# Patient Record
Sex: Female | Born: 1970 | Race: White | Hispanic: No | Marital: Married | Smoking: Former smoker
Health system: Southern US, Community
[De-identification: ages and names within clinical notes are randomized; demographics above are authoritative.]

## PROBLEM LIST (undated history)

## (undated) DIAGNOSIS — M797 Fibromyalgia: Secondary | ICD-10-CM

## (undated) DIAGNOSIS — G4700 Insomnia, unspecified: Secondary | ICD-10-CM

## (undated) DIAGNOSIS — I729 Aneurysm of unspecified site: Secondary | ICD-10-CM

## (undated) DIAGNOSIS — E119 Type 2 diabetes mellitus without complications: Secondary | ICD-10-CM

## (undated) DIAGNOSIS — E785 Hyperlipidemia, unspecified: Secondary | ICD-10-CM

## (undated) DIAGNOSIS — F419 Anxiety disorder, unspecified: Secondary | ICD-10-CM

## (undated) DIAGNOSIS — E079 Disorder of thyroid, unspecified: Secondary | ICD-10-CM

## (undated) DIAGNOSIS — K219 Gastro-esophageal reflux disease without esophagitis: Secondary | ICD-10-CM

## (undated) DIAGNOSIS — R51 Headache: Secondary | ICD-10-CM

## (undated) DIAGNOSIS — F32A Depression, unspecified: Secondary | ICD-10-CM

## (undated) DIAGNOSIS — M25511 Pain in right shoulder: Secondary | ICD-10-CM

## (undated) DIAGNOSIS — E039 Hypothyroidism, unspecified: Secondary | ICD-10-CM

## (undated) DIAGNOSIS — M1990 Unspecified osteoarthritis, unspecified site: Secondary | ICD-10-CM

## (undated) DIAGNOSIS — F329 Major depressive disorder, single episode, unspecified: Secondary | ICD-10-CM

## (undated) DIAGNOSIS — R519 Headache, unspecified: Secondary | ICD-10-CM

## (undated) HISTORY — PX: EXCISION/RELEASE BURSA HIP: SHX5014

## (undated) HISTORY — PX: OTHER SURGICAL HISTORY: SHX169

## (undated) HISTORY — PX: PLANTAR FASCIECTOMY: SUR600

## (undated) HISTORY — PX: SHOULDER ARTHROSCOPY: SHX128

## (undated) HISTORY — PX: KNEE ARTHROSCOPY: SUR90

## (undated) HISTORY — PX: ABDOMINAL HYSTERECTOMY: SHX81

## (undated) HISTORY — PX: BRAIN SURGERY: SHX531

---
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
## 2018-07-24 DIAGNOSIS — M25521 Pain in right elbow: Secondary | ICD-10-CM | POA: Diagnosis not present

## 2018-07-24 DIAGNOSIS — S59901A Unspecified injury of right elbow, initial encounter: Secondary | ICD-10-CM | POA: Diagnosis not present

## 2018-08-13 DIAGNOSIS — M25521 Pain in right elbow: Secondary | ICD-10-CM | POA: Diagnosis not present

## 2018-08-13 DIAGNOSIS — M7701 Medial epicondylitis, right elbow: Secondary | ICD-10-CM | POA: Diagnosis not present

## 2018-08-27 DIAGNOSIS — M25521 Pain in right elbow: Secondary | ICD-10-CM | POA: Diagnosis not present

## 2018-08-27 DIAGNOSIS — G8929 Other chronic pain: Secondary | ICD-10-CM | POA: Diagnosis not present

## 2018-09-06 DIAGNOSIS — M25521 Pain in right elbow: Secondary | ICD-10-CM | POA: Diagnosis not present

## 2018-09-06 DIAGNOSIS — G8929 Other chronic pain: Secondary | ICD-10-CM | POA: Diagnosis not present

## 2018-09-09 DIAGNOSIS — M25521 Pain in right elbow: Secondary | ICD-10-CM | POA: Diagnosis not present

## 2018-09-09 DIAGNOSIS — G8929 Other chronic pain: Secondary | ICD-10-CM | POA: Diagnosis not present

## 2018-09-12 DIAGNOSIS — G8929 Other chronic pain: Secondary | ICD-10-CM | POA: Diagnosis not present

## 2018-09-12 DIAGNOSIS — M25521 Pain in right elbow: Secondary | ICD-10-CM | POA: Diagnosis not present

## 2018-09-17 DIAGNOSIS — M25511 Pain in right shoulder: Secondary | ICD-10-CM | POA: Diagnosis not present

## 2018-09-17 DIAGNOSIS — R21 Rash and other nonspecific skin eruption: Secondary | ICD-10-CM | POA: Diagnosis not present

## 2018-09-17 DIAGNOSIS — M542 Cervicalgia: Secondary | ICD-10-CM | POA: Diagnosis not present

## 2018-09-17 DIAGNOSIS — R399 Unspecified symptoms and signs involving the genitourinary system: Secondary | ICD-10-CM | POA: Diagnosis not present

## 2018-09-17 DIAGNOSIS — R300 Dysuria: Secondary | ICD-10-CM | POA: Diagnosis not present

## 2018-09-20 DIAGNOSIS — Z23 Encounter for immunization: Secondary | ICD-10-CM | POA: Diagnosis not present

## 2018-09-20 DIAGNOSIS — E034 Atrophy of thyroid (acquired): Secondary | ICD-10-CM | POA: Diagnosis not present

## 2018-09-20 DIAGNOSIS — E7800 Pure hypercholesterolemia, unspecified: Secondary | ICD-10-CM | POA: Diagnosis not present

## 2018-09-20 DIAGNOSIS — Z0000 Encounter for general adult medical examination without abnormal findings: Secondary | ICD-10-CM | POA: Diagnosis not present

## 2018-09-20 DIAGNOSIS — R102 Pelvic and perineal pain: Secondary | ICD-10-CM | POA: Diagnosis not present

## 2018-09-23 ENCOUNTER — Other Ambulatory Visit: Payer: Self-pay | Admitting: Internal Medicine

## 2018-09-23 DIAGNOSIS — Z1231 Encounter for screening mammogram for malignant neoplasm of breast: Secondary | ICD-10-CM

## 2018-09-23 DIAGNOSIS — I729 Aneurysm of unspecified site: Secondary | ICD-10-CM | POA: Diagnosis not present

## 2018-09-23 DIAGNOSIS — R251 Tremor, unspecified: Secondary | ICD-10-CM | POA: Diagnosis not present

## 2018-09-24 DIAGNOSIS — M7711 Lateral epicondylitis, right elbow: Secondary | ICD-10-CM | POA: Diagnosis not present

## 2018-09-24 DIAGNOSIS — M25521 Pain in right elbow: Secondary | ICD-10-CM | POA: Diagnosis not present

## 2018-09-24 DIAGNOSIS — M7701 Medial epicondylitis, right elbow: Secondary | ICD-10-CM | POA: Diagnosis not present

## 2018-09-25 DIAGNOSIS — R102 Pelvic and perineal pain: Secondary | ICD-10-CM | POA: Diagnosis not present

## 2018-09-25 DIAGNOSIS — Z124 Encounter for screening for malignant neoplasm of cervix: Secondary | ICD-10-CM | POA: Diagnosis not present

## 2018-10-03 ENCOUNTER — Other Ambulatory Visit: Payer: Self-pay | Admitting: Student

## 2018-10-03 ENCOUNTER — Other Ambulatory Visit (HOSPITAL_COMMUNITY): Payer: Self-pay | Admitting: Sports Medicine

## 2018-10-03 ENCOUNTER — Other Ambulatory Visit: Payer: Self-pay | Admitting: Sports Medicine

## 2018-10-03 DIAGNOSIS — M7701 Medial epicondylitis, right elbow: Secondary | ICD-10-CM

## 2018-10-03 DIAGNOSIS — G5621 Lesion of ulnar nerve, right upper limb: Secondary | ICD-10-CM

## 2018-10-03 DIAGNOSIS — M7711 Lateral epicondylitis, right elbow: Secondary | ICD-10-CM

## 2018-10-03 DIAGNOSIS — M25521 Pain in right elbow: Secondary | ICD-10-CM

## 2018-10-04 ENCOUNTER — Other Ambulatory Visit: Payer: Self-pay | Admitting: Student

## 2018-10-04 DIAGNOSIS — I729 Aneurysm of unspecified site: Secondary | ICD-10-CM

## 2018-10-09 NOTE — H&P (Signed)
Michaela Caldwell is a 48 y.o. female here for Rf Arvilla Meres, NP-pelvic pain, hx of Endometriosis .pt new to me . Consult for pelvic pain . Pt has a long h/o of pelvic pain and menorrhagia , initially treated with L/S and endometrial ablation. Pain improved and now over the last 3 yr it has returned . No menses and pain is more constant  Bilateral throbbing . Mother deceased from with probable ovarian cancer .  + dyspareunia for 3 yrs . Pap have been normal in past . G1P1 via c/s    OP report from 2014 [place] -  No scar tissue , presumed adenomyosis   Past Medical History:  has a past medical history of Arthritis, Endometriosis of uterus, Fibromyalgia, GERD (gastroesophageal reflux disease), Hyperlipidemia, Insomnia, and Thyroid disease.  Past Surgical History:  has a past surgical history that includes RIGHT KNEE SURGERY; LEFT HIP SURGERY; Cesarean section; Arthroscopy Shoulder (Right); Plantar fasciectomy (Left); Pelvic laparoscopy (2011); and Hysteroscopy w/ endometrial ablation. Family History: family history includes Atrial fibrillation (Abnormal heart rhythm sometimes requiring treatment with blood thinners) in her father; Cancer in her mother; Dementia in her father; Stroke in her father. Social History:  reports that she has quit smoking. She has never used smokeless tobacco. She reports that she does not drink alcohol or use drugs. OB/GYN History:          OB History    Gravida  1   Para  1   Term      Preterm      AB      Living  1     SAB      TAB      Ectopic      Molar      Multiple      Live Births  1          Allergies: is allergic to augmentin [amoxicillin-pot clavulanate] and cinnamon. Medications:  Current Outpatient Medications:  .  clotrimazole-betamethasone (LOTRISONE) 1-0.05 % cream, Apply topically 2 (two) times daily, Disp: 45 g, Rfl: 0 .  DULoxetine (CYMBALTA) 30 MG DR capsule, Take 30 mg by mouth once daily , Disp: , Rfl:  .   ergocalciferol, vitamin D2, 1,250 mcg (50,000 unit) capsule, Take 1 capsule (50,000 Units total) by mouth once a week for 30 days, Disp: 12 capsule, Rfl: 1 .  etodolac (LODINE) 500 MG tablet, Take 1 tablet (500 mg total) by mouth 2 (two) times daily, Disp: 60 tablet, Rfl: 0 .  gabapentin (NEURONTIN) 100 MG capsule, Take 100 mg twice a day for one week, then increase to 200 mg(2 tablets) twice a day and continue, Disp: 120 capsule, Rfl: 3 .  levothyroxine (SYNTHROID) 25 MCG tablet, Take 25 mcg by mouth once daily Take on an empty stomach with a glass of water at least 30-60 minutes before breakfast., Disp: , Rfl:  .  omeprazole (PRILOSEC) 20 MG DR capsule, Take 20 mg by mouth once daily, Disp: , Rfl:  .  simvastatin (ZOCOR) 40 MG tablet, Take 40 mg by mouth nightly, Disp: , Rfl:  .  trazodone HCl (TRAZODONE ORAL), Take 50 mg by mouth nightly , Disp: , Rfl:  .  topiramate (TOPAMAX) 50 MG tablet, Take 75 mg by mouth once daily, Disp: , Rfl:   Review of Systems: General:                      No fatigue or weight loss Eyes:  No vision changes Ears:                            No hearing difficulty Respiratory:                No cough or shortness of breath Pulmonary:                  No asthma or shortness of breath Cardiovascular:           No chest pain, palpitations, dyspnea on exertion Gastrointestinal:          No abdominal bloating, chronic diarrhea, constipations, masses, pain or hematochezia Genitourinary:             No hematuria, dysuria, abnormal vaginal discharge,+ pelvic pain, Menometrorrhagia Lymphatic:                   No swollen lymph nodes Musculoskeletal:         No muscle weakness Neurologic:                  No extremity weakness, syncope, seizure disorder Psychiatric:                  No history of depression, delusions or suicidal/homicidal ideation    Exam:      Vitals:   09/25/18 0836  BP: 122/85  Pulse: 67    Body mass index is  33.41 kg/m.  WDWN white/ female in NAD   Lungs: CTA  CV : RRR without murmur    Neck:  no thyromegaly Abdomen: soft , no mass, normal active bowel sounds,  non-tender, no rebound tenderness Pelvic: tanner stage 5 ,  External genitalia: vulva /labia no lesions Urethra: no prolapse Vagina: normal physiologic d/c, limited room for a LAVH Cervix: no lesions, no cervical motion tenderness   Uterus: normal size shape and contour, non-tender Adnexa: no mass,  non-tender   Rectovaginal:  U/s :  Uterus retroverted  Fibroid seen: Lt posterior=1.9cm  Endometrium=3.24mm  No free fluid seen  B/L ovaries appear wnl Impression:   The primary encounter diagnosis was Pelvic pain in female. Diagnoses of Dyspareunia, female, Family history of ovarian cancer, and Routine cervical smear were also pertinent to this visit.    Plan:   I have spoken with the patient regarding treatment options including expectant management, hormonal options, or surgical intervention. After a full discussion the pt elects to proceed with LAVH and BSO given her mother's h/o ovarian cancer

## 2018-10-15 ENCOUNTER — Other Ambulatory Visit: Payer: Self-pay

## 2018-10-15 ENCOUNTER — Encounter
Admission: RE | Admit: 2018-10-15 | Discharge: 2018-10-15 | Disposition: A | Discharge status: Home / Self Care | Payer: 59 | Source: Ambulatory Visit | Attending: Obstetrics and Gynecology | Admitting: Obstetrics and Gynecology

## 2018-10-15 ENCOUNTER — Encounter: Payer: Self-pay | Admitting: *Deleted

## 2018-10-15 DIAGNOSIS — Z01818 Encounter for other preprocedural examination: Secondary | ICD-10-CM | POA: Diagnosis not present

## 2018-10-15 DIAGNOSIS — E7800 Pure hypercholesterolemia, unspecified: Secondary | ICD-10-CM | POA: Diagnosis not present

## 2018-10-15 HISTORY — DX: Hypothyroidism, unspecified: E03.9

## 2018-10-15 HISTORY — DX: Pain in right shoulder: M25.511

## 2018-10-15 HISTORY — DX: Anxiety disorder, unspecified: F41.9

## 2018-10-15 HISTORY — DX: Gastro-esophageal reflux disease without esophagitis: K21.9

## 2018-10-15 HISTORY — DX: Headache, unspecified: R51.9

## 2018-10-15 HISTORY — DX: Headache: R51

## 2018-10-15 HISTORY — DX: Hyperlipidemia, unspecified: E78.5

## 2018-10-15 HISTORY — DX: Fibromyalgia: M79.7

## 2018-10-15 LAB — CBC
HCT: 44.3 % (ref 36.0–46.0)
Hemoglobin: 14.7 g/dL (ref 12.0–15.0)
MCH: 29 pg (ref 26.0–34.0)
MCHC: 33.2 g/dL (ref 30.0–36.0)
MCV: 87.4 fL (ref 80.0–100.0)
Platelets: 242 10*3/uL (ref 150–400)
RBC: 5.07 MIL/uL (ref 3.87–5.11)
RDW: 12.8 % (ref 11.5–15.5)
WBC: 7.4 10*3/uL (ref 4.0–10.5)
nRBC: 0 % (ref 0.0–0.2)

## 2018-10-15 LAB — BASIC METABOLIC PANEL
Anion gap: 9 (ref 5–15)
BUN: 17 mg/dL (ref 6–20)
CO2: 24 mmol/L (ref 22–32)
Calcium: 9.7 mg/dL (ref 8.9–10.3)
Chloride: 105 mmol/L (ref 98–111)
Creatinine, Ser: 0.59 mg/dL (ref 0.44–1.00)
GFR calc Af Amer: >60 mL/min (ref >60)
GFR calc non Af Amer: >60 mL/min (ref >60)
GLUCOSE: 91 mg/dL (ref 70–99)
Potassium: 3.9 mmol/L (ref 3.5–5.1)
Sodium: 138 mmol/L (ref 135–145)

## 2018-10-15 LAB — TYPE AND SCREEN
ABO/RH(D): O POS
ANTIBODY SCREEN: NEGATIVE

## 2018-10-15 NOTE — Pre-Procedure Instructions (Signed)
Carbohydrate drink given. 

## 2018-10-15 NOTE — Patient Instructions (Addendum)
Your procedure is scheduled on: 10/21/18 Mon Report to Same Day Surgery 2nd floor medical mall [facility](Medical Mall Entrance-take elevator on left to 2nd floor.  Check in with surgery information desk.) To find out your arrival time please call [phone] between 1PM - 3PM on 10/18/18 Fri  Remember: Instructions that are not followed completely may result in serious medical risk, up to and including death, or upon the discretion of your surgeon and anesthesiologist your surgery may need to be rescheduled.    _x___ 1. Do not eat food after midnight the night before your procedure. You may drink clear liquids up to 2 hours before you are scheduled to arrive at the hospital for your procedure.  Do not drink clear liquids within 2 hours of your scheduled arrival to the hospital.  Clear liquids include  --Water or Apple juice without pulp  --Clear carbohydrate beverage such as ClearFast or Gatorade  --Black Coffee or Clear Tea (No milk, no creamers, do not add anything to                  the coffee or Tea Type 1 and type 2 diabetics should only drink water.   ____Ensure clear carbohydrate drink on the way to the hospital for bariatric patients  _x___Ensure clear carbohydrate drink 3 hours before surgery   No gum chewing or hard candies.     __x__ 2. No Alcohol for 24 hours before or after surgery.   __x__3. No Smoking or e-cigarettes for 24 prior to surgery.  Do not use any chewable tobacco products for at least 6 hour prior to surgery   ____  4. Bring all medications with you on the day of surgery if instructed.    __x__ 5. Notify your doctor if there is any change in your medical condition     (cold, fever, infections).    x___6. On the morning of surgery brush your teeth with toothpaste and water.  You may rinse your mouth with mouth wash if you wish.  Do not swallow any toothpaste or mouthwash.   Do not wear jewelry, make-up, hairpins, clips or nail polish.  Do not wear lotions, powders,  or perfumes. You may wear deodorant.  Do not shave 48 hours prior to surgery. Men may shave face and neck.  Do not bring valuables to the hospital.    [facility] is not responsible for any belongings or valuables.               Contacts, dentures or bridgework may not be worn into surgery.  Leave your suitcase in the car. After surgery it may be brought to your room.  For patients admitted to the hospital, discharge time is determined by your                       treatment team.  _  Patients discharged the day of surgery will not be allowed to drive home.  You will need someone to drive you home and stay with you the night of your procedure.    Please read over the following fact sheets that you were given:   [facility] Preparing for Surgery and or MRSA Information   _x___ Take anti-hypertensive listed below, cardiac, seizure, asthma,     anti-reflux and psychiatric medicines. These include:  1. DULoxetine (CYMBALTA) 30 MG capsule  2.gabapentin (NEURONTIN) 100 MG capsule  3.levothyroxine (SYNTHROID, LEVOTHROID) 25 MCG tablet  4.omeprazole (PRILOSEC) 20 MG capsule  5.simvastatin (  ZOCOR) 40 MG tablet  6.  ____Fleets enema or Magnesium Citrate as directed.   _x___ Use CHG Soap or sage wipes as directed on instruction sheet   ____ Use inhalers on the day of surgery and bring to hospital day of surgery  ____ Stop Metformin and Janumet 2 days prior to surgery.    ____ Take 1/2 of usual insulin dose the night before surgery and none on the morning     surgery.   _x___ Follow recommendations from Cardiologist, Pulmonologist or PCP regarding          stopping Aspirin, Coumadin, Plavix ,Eliquis, Effient, or Pradaxa, and Pletal.  X____Stop Anti-inflammatories such as Advil, Aleve, Ibuprofen, Motrin, Naproxen, Naprosyn, Goodies powders or aspirin products. OK to take Tylenol and                          Celebrex.   _x___ Stop supplements until after surgery.  But may continue Vitamin D,  Vitamin B,       and multivitamin.   ____ Bring C-Pap to the hospital.

## 2018-10-21 ENCOUNTER — Observation Stay
Admission: RE | Admit: 2018-10-21 | Discharge: 2018-10-21 | Disposition: A | Discharge status: Home / Self Care | Payer: 59 | Attending: Obstetrics and Gynecology | Admitting: Obstetrics and Gynecology

## 2018-10-21 ENCOUNTER — Other Ambulatory Visit: Payer: Self-pay

## 2018-10-21 ENCOUNTER — Observation Stay: Payer: 59 | Admitting: Anesthesiology

## 2018-10-21 ENCOUNTER — Encounter
Admission: RE | Disposition: A | Discharge status: Home / Self Care | Payer: Self-pay | Source: Home / Self Care | Attending: Obstetrics and Gynecology

## 2018-10-21 ENCOUNTER — Encounter: Payer: Self-pay | Admitting: Anesthesiology

## 2018-10-21 DIAGNOSIS — F419 Anxiety disorder, unspecified: Secondary | ICD-10-CM | POA: Insufficient documentation

## 2018-10-21 DIAGNOSIS — G8929 Other chronic pain: Secondary | ICD-10-CM | POA: Insufficient documentation

## 2018-10-21 DIAGNOSIS — N800 Endometriosis of uterus: Secondary | ICD-10-CM | POA: Insufficient documentation

## 2018-10-21 DIAGNOSIS — Z881 Allergy status to other antibiotic agents status: Secondary | ICD-10-CM | POA: Diagnosis not present

## 2018-10-21 DIAGNOSIS — Z87891 Personal history of nicotine dependence: Secondary | ICD-10-CM | POA: Diagnosis not present

## 2018-10-21 DIAGNOSIS — N920 Excessive and frequent menstruation with regular cycle: Principal | ICD-10-CM | POA: Insufficient documentation

## 2018-10-21 DIAGNOSIS — K219 Gastro-esophageal reflux disease without esophagitis: Secondary | ICD-10-CM | POA: Diagnosis not present

## 2018-10-21 DIAGNOSIS — E785 Hyperlipidemia, unspecified: Secondary | ICD-10-CM | POA: Insufficient documentation

## 2018-10-21 DIAGNOSIS — M797 Fibromyalgia: Secondary | ICD-10-CM | POA: Diagnosis not present

## 2018-10-21 DIAGNOSIS — Z79899 Other long term (current) drug therapy: Secondary | ICD-10-CM | POA: Diagnosis not present

## 2018-10-21 DIAGNOSIS — E039 Hypothyroidism, unspecified: Secondary | ICD-10-CM | POA: Insufficient documentation

## 2018-10-21 DIAGNOSIS — N941 Unspecified dyspareunia: Secondary | ICD-10-CM | POA: Insufficient documentation

## 2018-10-21 DIAGNOSIS — Z79890 Hormone replacement therapy (postmenopausal): Secondary | ICD-10-CM | POA: Diagnosis not present

## 2018-10-21 DIAGNOSIS — Z9889 Other specified postprocedural states: Secondary | ICD-10-CM

## 2018-10-21 DIAGNOSIS — R51 Headache: Secondary | ICD-10-CM | POA: Diagnosis not present

## 2018-10-21 DIAGNOSIS — Z880 Allergy status to penicillin: Secondary | ICD-10-CM | POA: Insufficient documentation

## 2018-10-21 DIAGNOSIS — R102 Pelvic and perineal pain: Secondary | ICD-10-CM | POA: Insufficient documentation

## 2018-10-21 HISTORY — PX: LAPAROSCOPIC VAGINAL HYSTERECTOMY WITH SALPINGO OOPHORECTOMY: SHX6681

## 2018-10-21 LAB — ABO/RH: ABO/RH(D): O POS

## 2018-10-21 SURGERY — HYSTERECTOMY, VAGINAL, LAPAROSCOPY-ASSISTED, WITH SALPINGO-OOPHORECTOMY
Anesthesia: General | Laterality: Bilateral

## 2018-10-21 MED ORDER — ONDANSETRON HCL 4 MG/2ML IJ SOLN
INTRAMUSCULAR | Status: AC
  Filled 2018-10-21: qty 2

## 2018-10-21 MED ORDER — ROCURONIUM BROMIDE 50 MG/5ML IV SOLN
INTRAVENOUS | Status: AC
  Filled 2018-10-21: qty 1

## 2018-10-21 MED ORDER — DEXAMETHASONE SODIUM PHOSPHATE 10 MG/ML IJ SOLN
INTRAMUSCULAR | Status: AC
  Filled 2018-10-21: qty 1

## 2018-10-21 MED ORDER — SUGAMMADEX SODIUM 200 MG/2ML IV SOLN
INTRAVENOUS | Status: DC | PRN
  Administered 2018-10-21: 200 mg via INTRAVENOUS

## 2018-10-21 MED ORDER — GLYCOPYRROLATE 0.2 MG/ML IJ SOLN
INTRAMUSCULAR | Status: DC | PRN
  Administered 2018-10-21: 0.2 mg via INTRAVENOUS

## 2018-10-21 MED ORDER — ONDANSETRON HCL 4 MG/2ML IJ SOLN
INTRAMUSCULAR | Status: DC | PRN
  Administered 2018-10-21: 4 mg via INTRAVENOUS

## 2018-10-21 MED ORDER — BUPIVACAINE HCL 0.5 % IJ SOLN
INTRAMUSCULAR | Status: DC | PRN
  Administered 2018-10-21: 10 mL

## 2018-10-21 MED ORDER — ACETAMINOPHEN 500 MG PO TABS
ORAL_TABLET | ORAL | Status: AC
  Administered 2018-10-21: 1000 mg via ORAL
  Filled 2018-10-21: qty 2

## 2018-10-21 MED ORDER — GABAPENTIN 300 MG PO CAPS
ORAL_CAPSULE | ORAL | Status: AC
  Filled 2018-10-21: qty 1

## 2018-10-21 MED ORDER — LIDOCAINE HCL 4 % MT SOLN
OROMUCOSAL | Status: DC | PRN
  Administered 2018-10-21: 4 mL via TOPICAL

## 2018-10-21 MED ORDER — ONDANSETRON HCL 4 MG/2ML IJ SOLN: 4.0000 mg | Freq: Four times a day (QID) | INTRAMUSCULAR | Status: DC | PRN

## 2018-10-21 MED ORDER — CELECOXIB 200 MG PO CAPS
400.0000 mg | ORAL_CAPSULE | ORAL | Status: AC
  Administered 2018-10-21: 400 mg via ORAL

## 2018-10-21 MED ORDER — LIDOCAINE-EPINEPHRINE 1 %-1:100000 IJ SOLN
INTRAMUSCULAR | Status: DC | PRN
  Administered 2018-10-21: 10 mL

## 2018-10-21 MED ORDER — DEXMEDETOMIDINE HCL 200 MCG/2ML IV SOLN
INTRAVENOUS | Status: DC | PRN
  Administered 2018-10-21: 8 ug via INTRAVENOUS

## 2018-10-21 MED ORDER — SEVOFLURANE IN SOLN
RESPIRATORY_TRACT | Status: AC
  Filled 2018-10-21: qty 250

## 2018-10-21 MED ORDER — FENTANYL CITRATE (PF) 100 MCG/2ML IJ SOLN
INTRAMUSCULAR | Status: AC
  Administered 2018-10-21: 25 ug via INTRAVENOUS
  Filled 2018-10-21: qty 2

## 2018-10-21 MED ORDER — OXYCODONE HCL 5 MG PO TABS
5.0000 mg | ORAL_TABLET | ORAL | Status: DC | PRN
  Administered 2018-10-21: 10 mg via ORAL
  Filled 2018-10-21: qty 2

## 2018-10-21 MED ORDER — BUPIVACAINE HCL (PF) 0.5 % IJ SOLN
INTRAMUSCULAR | Status: AC
  Filled 2018-10-21: qty 30

## 2018-10-21 MED ORDER — MIDAZOLAM HCL 2 MG/2ML IJ SOLN
INTRAMUSCULAR | Status: DC | PRN
  Administered 2018-10-21: 2 mg via INTRAVENOUS

## 2018-10-21 MED ORDER — LACTATED RINGERS IV SOLN
INTRAVENOUS | Status: DC
  Administered 2018-10-21: 09:00:00 via INTRAVENOUS

## 2018-10-21 MED ORDER — FENTANYL CITRATE (PF) 100 MCG/2ML IJ SOLN
INTRAMUSCULAR | Status: AC
  Filled 2018-10-21: qty 2

## 2018-10-21 MED ORDER — KETOROLAC TROMETHAMINE 30 MG/ML IJ SOLN
INTRAMUSCULAR | Status: AC
  Administered 2018-10-21: 30 mg via INTRAVENOUS
  Filled 2018-10-21: qty 1

## 2018-10-21 MED ORDER — HYDROMORPHONE HCL 1 MG/ML IJ SOLN
0.5000 mg | INTRAMUSCULAR | Status: DC | PRN
  Administered 2018-10-21 (×2): 0.5 mg via INTRAVENOUS

## 2018-10-21 MED ORDER — LACTATED RINGERS IV SOLN
INTRAVENOUS | Status: DC
  Administered 2018-10-21: 13:00:00 via INTRAVENOUS

## 2018-10-21 MED ORDER — MIDAZOLAM HCL 2 MG/2ML IJ SOLN
INTRAMUSCULAR | Status: AC
  Filled 2018-10-21: qty 2

## 2018-10-21 MED ORDER — ROCURONIUM BROMIDE 100 MG/10ML IV SOLN
INTRAVENOUS | Status: DC | PRN
  Administered 2018-10-21: 50 mg via INTRAVENOUS
  Administered 2018-10-21: 20 mg via INTRAVENOUS

## 2018-10-21 MED ORDER — ACETAMINOPHEN 500 MG PO TABS
1000.0000 mg | ORAL_TABLET | ORAL | Status: AC
  Administered 2018-10-21: 1000 mg via ORAL

## 2018-10-21 MED ORDER — GABAPENTIN 300 MG PO CAPS: 300.0000 mg | ORAL_CAPSULE | ORAL | Status: DC

## 2018-10-21 MED ORDER — CELECOXIB 200 MG PO CAPS
ORAL_CAPSULE | ORAL | Status: AC
  Administered 2018-10-21: 400 mg via ORAL
  Filled 2018-10-21: qty 2

## 2018-10-21 MED ORDER — ONDANSETRON HCL 4 MG/2ML IJ SOLN: 4.0000 mg | Freq: Once | INTRAMUSCULAR | Status: DC | PRN

## 2018-10-21 MED ORDER — DEXAMETHASONE SODIUM PHOSPHATE 10 MG/ML IJ SOLN
INTRAMUSCULAR | Status: DC | PRN
  Administered 2018-10-21: 8 mg via INTRAVENOUS

## 2018-10-21 MED ORDER — EPHEDRINE SULFATE 50 MG/ML IJ SOLN
INTRAMUSCULAR | Status: DC | PRN
  Administered 2018-10-21: 15 mg via INTRAVENOUS
  Administered 2018-10-21: 10 mg via INTRAVENOUS

## 2018-10-21 MED ORDER — ONDANSETRON HCL 4 MG PO TABS: 4.0000 mg | ORAL_TABLET | Freq: Four times a day (QID) | ORAL | Status: DC | PRN

## 2018-10-21 MED ORDER — LIDOCAINE HCL (CARDIAC) PF 100 MG/5ML IV SOSY
PREFILLED_SYRINGE | INTRAVENOUS | Status: DC | PRN
  Administered 2018-10-21: 90 mg via INTRAVENOUS

## 2018-10-21 MED ORDER — FENTANYL CITRATE (PF) 100 MCG/2ML IJ SOLN
INTRAMUSCULAR | Status: DC | PRN
  Administered 2018-10-21 (×3): 50 ug via INTRAVENOUS

## 2018-10-21 MED ORDER — PROPOFOL 10 MG/ML IV BOLUS
INTRAVENOUS | Status: DC | PRN
  Administered 2018-10-21: 160 mg via INTRAVENOUS

## 2018-10-21 MED ORDER — LACTATED RINGERS IV SOLN: INTRAVENOUS | Status: DC

## 2018-10-21 MED ORDER — CEFAZOLIN SODIUM-DEXTROSE 2-4 GM/100ML-% IV SOLN
INTRAVENOUS | Status: AC
  Filled 2018-10-21: qty 100

## 2018-10-21 MED ORDER — HYDROMORPHONE HCL 1 MG/ML IJ SOLN
INTRAMUSCULAR | Status: AC
  Administered 2018-10-21: 0.5 mg via INTRAVENOUS
  Filled 2018-10-21: qty 1

## 2018-10-21 MED ORDER — MORPHINE SULFATE (PF) 2 MG/ML IV SOLN: 1.0000 mg | INTRAVENOUS | Status: DC | PRN

## 2018-10-21 MED ORDER — CEFAZOLIN SODIUM-DEXTROSE 2-4 GM/100ML-% IV SOLN
2.0000 g | Freq: Once | INTRAVENOUS | Status: AC
  Administered 2018-10-21: 2 g via INTRAVENOUS

## 2018-10-21 MED ORDER — KETOROLAC TROMETHAMINE 30 MG/ML IJ SOLN
30.0000 mg | Freq: Once | INTRAMUSCULAR | Status: AC
  Administered 2018-10-21: 30 mg via INTRAVENOUS
  Filled 2018-10-21: qty 1

## 2018-10-21 MED ORDER — ACETAMINOPHEN 500 MG PO TABS
1000.0000 mg | ORAL_TABLET | Freq: Four times a day (QID) | ORAL | Status: DC
  Administered 2018-10-21 (×2): 1000 mg via ORAL
  Filled 2018-10-21 (×2): qty 2

## 2018-10-21 MED ORDER — FENTANYL CITRATE (PF) 100 MCG/2ML IJ SOLN
25.0000 ug | INTRAMUSCULAR | Status: AC | PRN
  Administered 2018-10-21 (×6): 25 ug via INTRAVENOUS

## 2018-10-21 MED ORDER — PROPOFOL 10 MG/ML IV BOLUS
INTRAVENOUS | Status: AC
  Filled 2018-10-21: qty 20

## 2018-10-21 MED ORDER — LIDOCAINE-EPINEPHRINE 1 %-1:100000 IJ SOLN
INTRAMUSCULAR | Status: AC
  Filled 2018-10-21: qty 1

## 2018-10-21 MED ORDER — LIDOCAINE HCL (PF) 2 % IJ SOLN
INTRAMUSCULAR | Status: AC
  Filled 2018-10-21: qty 10

## 2018-10-21 SURGICAL SUPPLY — 49 items
BAG URINE DRAINAGE (UROLOGICAL SUPPLIES) ×2 IMPLANT
BLADE SURG SZ11 CARB STEEL (BLADE) ×2 IMPLANT
CATH FOLEY 2WAY  5CC 16FR (CATHETERS) ×1
CATH URTH 16FR FL 2W BLN LF (CATHETERS) ×1 IMPLANT
CHLORAPREP W/TINT 26ML (MISCELLANEOUS) ×3 IMPLANT
COVER WAND RF STERILE (DRAPES) ×1 IMPLANT
DERMABOND ADVANCED (GAUZE/BANDAGES/DRESSINGS)
DERMABOND ADVANCED .7 DNX12 (GAUZE/BANDAGES/DRESSINGS) ×1 IMPLANT
DRAPE SURG 17X11 SM STRL (DRAPES) ×2 IMPLANT
DRSG TEGADERM 2-3/8X2-3/4 SM (GAUZE/BANDAGES/DRESSINGS) ×4 IMPLANT
ELECT REM PT RETURN 9FT ADLT (ELECTROSURGICAL) ×2
ELECTRODE REM PT RTRN 9FT ADLT (ELECTROSURGICAL) ×1 IMPLANT
FILTER LAP SMOKE EVAC STRL (MISCELLANEOUS) ×1 IMPLANT
GAUZE 4X4 16PLY RFD (DISPOSABLE) ×1 IMPLANT
GLOVE BIO SURGEON STRL SZ8 (GLOVE) ×8 IMPLANT
GOWN STRL REUS W/ TWL LRG LVL3 (GOWN DISPOSABLE) ×3 IMPLANT
GOWN STRL REUS W/ TWL XL LVL3 (GOWN DISPOSABLE) ×3 IMPLANT
GOWN STRL REUS W/TWL LRG LVL3 (GOWN DISPOSABLE) ×3
GOWN STRL REUS W/TWL XL LVL3 (GOWN DISPOSABLE) ×1
GRASPER SUT TROCAR 14GX15 (MISCELLANEOUS) ×1 IMPLANT
HANDLE YANKAUER SUCT BULB TIP (MISCELLANEOUS) ×1 IMPLANT
IRRIGATION STRYKERFLOW (MISCELLANEOUS) ×1 IMPLANT
IRRIGATOR STRYKERFLOW (MISCELLANEOUS)
KIT PINK PAD W/HEAD ARE REST (MISCELLANEOUS) ×2
KIT PINK PAD W/HEAD ARM REST (MISCELLANEOUS) ×1 IMPLANT
KIT TURNOVER CYSTO (KITS) ×2 IMPLANT
LABEL OR SOLS (LABEL) ×2 IMPLANT
NEEDLE HYPO 22GX1.5 SAFETY (NEEDLE) ×2 IMPLANT
PACK BASIN MINOR ARMC (MISCELLANEOUS) ×2 IMPLANT
PACK GYN LAPAROSCOPIC (MISCELLANEOUS) ×2 IMPLANT
PAD OB MATERNITY 4.3X12.25 (PERSONAL CARE ITEMS) ×2 IMPLANT
SCISSORS METZENBAUM CVD 33 (INSTRUMENTS) ×1 IMPLANT
SHEARS HARMONIC ACE PLUS 36CM (ENDOMECHANICALS) ×2 IMPLANT
SLEEVE ENDOPATH XCEL 5M (ENDOMECHANICALS) ×3 IMPLANT
SOLUTION ELECTROLUBE (MISCELLANEOUS) ×1 IMPLANT
SPONGE GAUZE 2X2 8PLY STRL LF (GAUZE/BANDAGES/DRESSINGS) ×3 IMPLANT
STRIP CLOSURE SKIN 1/2X4 (GAUZE/BANDAGES/DRESSINGS) ×2 IMPLANT
SUT VIC AB 0 CT1 27 (SUTURE) ×2
SUT VIC AB 0 CT1 27XCR 8 STRN (SUTURE) ×2 IMPLANT
SUT VIC AB 0 CT1 36 (SUTURE) ×3 IMPLANT
SUT VIC AB 0 CT2 27 (SUTURE) ×2 IMPLANT
SUT VIC AB 2-0 UR6 27 (SUTURE) ×2 IMPLANT
SUT VIC AB 4-0 SH 27 (SUTURE) ×1
SUT VIC AB 4-0 SH 27XANBCTRL (SUTURE) ×1 IMPLANT
SYR 10ML LL (SYRINGE) ×2 IMPLANT
SYR CONTROL 10ML (SYRINGE) ×2 IMPLANT
TROCAR ENDO BLADELESS 11MM (ENDOMECHANICALS) ×2 IMPLANT
TROCAR XCEL NON-BLD 5MMX100MML (ENDOMECHANICALS) ×2 IMPLANT
TUBING EVAC SMOKE HEATED PNEUM (TUBING) ×2 IMPLANT

## 2018-10-21 NOTE — Progress Notes (Signed)
Discharge instructions along with home medications and follow up gone over with patient and husband. Both verbalize that they understood instructions. No prescriptions given to patient. IV removed. Pt being discharged home on room air, no distress noted. Kaylee Wombles S Fenton, RN 

## 2018-10-21 NOTE — Op Note (Signed)
NAME: Michaela, Caldwell MEDICAL RECORD ZD:[id number] ACCOUNT [id number] DATE OF BIRTH:May 24, 1971 FACILITY: [facility] LOCATION: [facility] PHYSICIAN:THOMAS Cloyde Reams, MD  OPERATIVE REPORT  DATE OF PROCEDURE:  10/21/2018  PREOPERATIVE DIAGNOSES: 1.  Chronic pelvic pain. 2.  Menorrhagia, status post failed endometrial ablation.  POSTOPERATIVE DIAGNOSES: 1.  Chronic pelvic pain. 2.  Menorrhagia, status post failed endometrial ablation.  PROCEDURE: 1.  Laparoscopic assisted vaginal hysterectomy. 2.  Bilateral salpingo-oophorectomy.  ANESTHESIA:  General endotracheal anesthesia.  SURGEON:  Suzy Bouchard, MD  FIRST ASSISTANT:  Christeen Douglas, MD   SECOND ASSISTANT:  PA student Joline Maxcy Essendelft.  INDICATIONS:  A 48 year old gravida 1, para 1 patient who is status post a NovaSure endometrial ablation 3 years ago, which initially was working and since that time, her bleeding has returned with heavy cyclic bleeding.  The patient also complains of  chronic pelvic pain and dyspareunia.  She has been diagnosed with endometriosis in the past.  The patient's mother is deceased status post ovarian cancer diagnosis.  The patient elects to have fallopian tubes and ovaries removed at the time of the  procedure.  DESCRIPTION OF PROCEDURE:  After adequate general endotracheal anesthesia, the patient was placed in dorsal supine position with the legs in the [place] stirrups.  The patient's abdomen, perineum and vagina were prepped and draped in normal sterile  fashion.  A timeout was performed.  The patient did receive 2 grams IV Ancef prior to commencement of the case.  The patient's bladder was drained with a Foley catheter and was left in place during the abdominal portion of the procedure.  A speculum was  placed and the cervix was grasped with a single tooth tenaculum.  Gloves were changed.  Attention was directed to the patient's abdomen where a 5 mm infraumbilical incision was  made after injecting with 0.5% Marcaine.  A 5 mm 0 degree scope was advanced  into the abdominal cavity under direct visualization with the Optiview cannula.  Second port placement was a left lower quadrant 3 cm medial to the left anterior iliac spine under direct visualization, 5 mm trocar was advanced.  Likewise on the right, a  5 mm trocar was advanced also 3 cm medial to the right anterior iliac spine.  Initial impression showed adhesions of the left descending colon to the left sidewall.  The fallopian tubes, ovaries and uterus appeared free of adhesions.  Uterus was 6-7  weeks in size.  A Harmonic scalpel was brought up.  Attention was directed to the patient's left adnexa.  The infundibulopelvic ligament was cauterized and transected.  Harmonic scalpel was then used to open the left broad ligament and the left uterine  artery was skeletonized.  The left uterine artery was then cauterized with the Kleppinger cautery and Harmonic scalpel was then used to transect the left uterine artery.  Bladder flap was created and the tissues were free inferior to the bladder.  A  similar procedure was repeated on the patient's right side.  Again, the right infundibulopelvic ligament was cauterized and transected.  Sequential bites with the Harmonic scalpel used to the level of the right uterine artery, which was skeletonized.   Kleppinger cautery was used to cauterize the artery and then the artery was transected.  Attention was then directed vaginally where the cervix was grasped with two single tooth tenacula.  The Foley catheter was removed temporarily.  The cervix was  circumferentially injected with 0.5 lidocaine with 1:100,000 epinephrine.  A direct posterior colpotomy incision was  made.  Upon entry into the posterior cul-de-sac, a long billed weighted speculum was placed.  The uterosacral ligaments were then  bilaterally clamped, transected, suture ligated for later identification.  The anterior cervix was  circumferentially incised with the Bovie, and the anterior cul-de-sac was entered sharply.  The cardinal ligaments were then bilaterally clamped,  transected and suture ligated with 0 Vicryl suture.  The uterus was then delivered with the attached fallopian tubes and ovaries.  Additional figure-of-eight suture was required on both sides to control hemostasis.  The vaginal vault was then closed with  a running 0 Vicryl suture.  Good approximation of tissues.  Good hemostasis was noted.  Foley catheter was replaced with adequate urine output.  Gloves were changed and the patient's abdomen was insufflated again and the pedicles appeared normal.   Ureters which were previously identified prior to starting the surgery had normal peristaltic activity and postoperatively had normal peristaltic activity bilaterally.  The patient's abdomen was then deflated and the 3 incisions were closed with  interrupted 4-0 Vicryl suture.  Sterile dressing applied.  COMPLICATIONS:  There were no complications.  ESTIMATED BLOOD LOSS:  50 mL  INTRAOPERATIVE FLUIDS:  600 mL  URINE OUTPUT:  150 mL  DISPOSITION:  The patient was taken to recovery room in good condition.  TN/NUANCE  D:10/21/2018 T:10/21/2018 JOB:[id number]

## 2018-10-21 NOTE — Discharge Summary (Signed)
Physician Discharge Summary  Patient ID: Michaela Caldwell: [id number] DOB/AGE: 50/09/1970 50 y.o.  Admit date: 10/21/2018 Discharge date: 10/21/2018  Admission Diagnoses:pelvic pain, dysparuenia  Discharge Diagnoses: same  Active Problems:   Postoperative state   Discharged Condition: good  Hospital Course: pt underwent an uncomplicated LAVH / BSO / Post op did well . Tolerating food and urinating without difficulty . VSS  Consults: None  Significant Diagnostic Studies: Treatments: surgery: as above  Discharge Exam: Blood pressure 109/69, pulse 60, temperature 97.7 F (36.5 C), temperature source Oral, resp. rate 16, height 5\' 6"  (1.676 m), weight 92.1 kg, last menstrual period 10/15/2013, SpO2 97 %. General appearance: alert and cooperative GI: incisions C/D/I  Disposition: Discharge disposition: 01-Home or Self Care       Discharge Instructions    Call MD for:   Complete by:  As directed    Call MD for:  difficulty breathing, headache or visual disturbances   Complete by:  As directed    Call MD for:  extreme fatigue   Complete by:  As directed    Call MD for:  hives   Complete by:  As directed    Call MD for:  persistant dizziness or light-headedness   Complete by:  As directed    Call MD for:  persistant nausea and vomiting   Complete by:  As directed    Call MD for:  redness, tenderness, or signs of infection (pain, swelling, redness, odor or green/yellow discharge around incision site)   Complete by:  As directed    Call MD for:  severe uncontrolled pain   Complete by:  As directed    Call MD for:  temperature >100.4   Complete by:  As directed    Diet - low sodium heart healthy   Complete by:  As directed    Increase activity slowly   Complete by:  As directed       Follow-up Information    Abubakar Crispo, Ihor Austinhomas J, MD Follow up in 2 week(s).   Specialty:  Obstetrics and Gynecology Why:  video visit  Contact information: [address] [facility] [place] [id number] [phone]           Signed: Ihor Austinhomas J Jaziya Obarr 10/21/2018, 8:20 PM

## 2018-10-21 NOTE — Anesthesia Post-op Follow-up Note (Signed)
Anesthesia QCDR form completed.        

## 2018-10-21 NOTE — Anesthesia Procedure Notes (Addendum)
Procedure Name: Intubation Date/Time: 10/21/2018 9:54 AM Performed by: Eben Burow, CRNA Pre-anesthesia Checklist: Patient identified, Emergency Drugs available, Suction available, Patient being monitored and Timeout performed Patient Re-evaluated:Patient Re-evaluated prior to induction Oxygen Delivery Method: Circle system utilized Preoxygenation: Pre-oxygenation with 100% oxygen Induction Type: IV induction Ventilation: Mask ventilation without difficulty Laryngoscope Size: Miller and 2 Grade View: Grade I Tube type: Oral Tube size: 7.0 mm Number of attempts: 1 Airway Equipment and Method: Stylet and LTA kit utilized Placement Confirmation: ETT inserted through vocal cords under direct vision,  positive ETCO2 and breath sounds checked- equal and bilateral Secured at: 20 cm Tube secured with: Tape Dental Injury: Teeth and Oropharynx as per pre-operative assessment

## 2018-10-21 NOTE — Brief Op Note (Signed)
10/21/2018  11:52 AM  PATIENT:  Michaela Caldwell  48 y.o. female  PRE-OPERATIVE DIAGNOSIS:  chronic pelvic pain, menorrhagia  POST-OPERATIVE DIAGNOSIS:  chronic pelvic pain,menorrhagia  PROCEDURE:  Procedure(s): LAPAROSCOPIC ASSISTED VAGINAL HYSTERECTOMY WITH SALPINGO OOPHORECTOMY (Bilateral)  SURGEON:  Surgeon(s) and Role:    * Stetson Pelaez, Ihor Austin, MD - Primary    * Christeen Douglas, MD - Assisting  PHYSICIAN ASSISTANT: PA student Zenaida Niece Essendelft   ASSISTANTS: none   ANESTHESIA: geta  BLOOD ADMINISTERED:none  DRAINS: Urinary Catheter (Foley)   LOCAL MEDICATIONS USED:  MARCAINE     SPECIMEN:  Source of Specimen:  cervix , uterus , bilateral fallopian tubes and ovaries   DISPOSITION OF SPECIMEN:  PATHOLOGY  COUNTS:  YES  TOURNIQUET:  * No tourniquets in log *  DICTATION: .Other Dictation: Dictation Number verbal  PLAN OF CARE: observation PATIENT DISPOSITION:  PACU - hemodynamically stable.   Delay start of Pharmacological VTE agent (>24hrs) due to surgical blood loss or risk of bleeding: not applicable

## 2018-10-21 NOTE — Anesthesia Postprocedure Evaluation (Signed)
Anesthesia Post Note  Patient: Michaela Caldwell  Procedure(s) Performed: LAPAROSCOPIC ASSISTED VAGINAL HYSTERECTOMY WITH SALPINGO OOPHORECTOMY (Bilateral )  Patient location during evaluation: PACU Anesthesia Type: General Level of consciousness: awake and alert Pain management: pain level controlled Vital Signs Assessment: post-procedure vital signs reviewed and stable Respiratory status: spontaneous breathing, nonlabored ventilation, respiratory function stable and patient connected to nasal cannula oxygen Cardiovascular status: blood pressure returned to baseline and stable Postop Assessment: no apparent nausea or vomiting Anesthetic complications: no     Last Vitals:  Vitals:   10/21/18 1315 10/21/18 1403  BP:  108/60  Pulse: 80 64  Resp: 12 16  Temp:  36.6 C  SpO2: 94% 97%    Last Pain:  Vitals:   10/21/18 1403  TempSrc: Oral  PainSc:                  Shariah Assad S

## 2018-10-21 NOTE — Anesthesia Preprocedure Evaluation (Signed)
Anesthesia Evaluation  Patient identified by MRN, date of birth, ID band Patient awake    Reviewed: Allergy & Precautions, NPO status , Patient's Chart, lab work & pertinent test results, reviewed documented beta blocker date and time   Airway Mallampati: III  TM Distance: >3 FB     Dental  (+) Chipped   Pulmonary former smoker,           Cardiovascular      Neuro/Psych  Headaches, Anxiety  Neuromuscular disease    GI/Hepatic GERD  Controlled,  Endo/Other  Hypothyroidism   Renal/GU      Musculoskeletal  (+) Fibromyalgia -  Abdominal   Peds  Hematology   Anesthesia Other Findings EKG ok.  Reproductive/Obstetrics                             Anesthesia Physical Anesthesia Plan  ASA: II  Anesthesia Plan: General   Post-op Pain Management:    Induction: Intravenous  PONV Risk Score and Plan:   Airway Management Planned: Oral ETT  Additional Equipment:   Intra-op Plan:   Post-operative Plan:   Informed Consent: I have reviewed the patients History and Physical, chart, labs and discussed the procedure including the risks, benefits and alternatives for the proposed anesthesia with the patient or authorized representative who has indicated his/her understanding and acceptance.       Plan Discussed with: CRNA  Anesthesia Plan Comments:         Anesthesia Quick Evaluation

## 2018-10-21 NOTE — Progress Notes (Signed)
Labs reviewed . Scheduled for LAVH/ bso  All questions answered . Proceed

## 2018-10-21 NOTE — Transfer of Care (Signed)
Immediate Anesthesia Transfer of Care Note  Patient: Michaela Caldwell  Procedure(s) Performed: LAPAROSCOPIC ASSISTED VAGINAL HYSTERECTOMY WITH SALPINGO OOPHORECTOMY (Bilateral )  Patient Location: PACU  Anesthesia Type:General  Level of Consciousness: drowsy  Airway & Oxygen Therapy: Patient Spontanous Breathing and Patient connected to face mask oxygen  Post-op Assessment: Report given to RN and Post -op Vital signs reviewed and stable  Post vital signs: Reviewed and stable  Last Vitals:  Vitals Value Taken Time  BP 126/61 10/21/2018 12:03 PM  Temp 36.5 C 10/21/2018 12:03 PM  Pulse 65 10/21/2018 12:08 PM  Resp 19 10/21/2018 12:08 PM  SpO2 99 % 10/21/2018 12:08 PM  Vitals shown include unvalidated device data.  Last Pain:  Vitals:   10/21/18 1203  TempSrc:   PainSc: Asleep         Complications: No apparent anesthesia complications

## 2018-10-23 LAB — SURGICAL PATHOLOGY

## 2018-11-12 ENCOUNTER — Ambulatory Visit
Admission: RE | Admit: 2018-11-12 | Discharge: 2018-11-12 | Disposition: A | Discharge status: Home / Self Care | Payer: 59 | Source: Ambulatory Visit | Attending: Student | Admitting: Student

## 2018-11-12 ENCOUNTER — Other Ambulatory Visit: Payer: Self-pay

## 2018-11-12 DIAGNOSIS — I729 Aneurysm of unspecified site: Secondary | ICD-10-CM | POA: Diagnosis not present

## 2018-11-12 MED ORDER — IOHEXOL 350 MG/ML SOLN
75.0000 mL | Freq: Once | INTRAVENOUS | Status: AC | PRN
  Administered 2018-11-12: 75 mL via INTRAVENOUS

## 2019-06-06 ENCOUNTER — Emergency Department: Payer: 59

## 2019-06-06 ENCOUNTER — Observation Stay
Admission: EM | Admit: 2019-06-06 | Discharge: 2019-06-07 | Disposition: A | Discharge status: Home / Self Care | Payer: 59 | Attending: Internal Medicine | Admitting: Internal Medicine

## 2019-06-06 ENCOUNTER — Observation Stay: Payer: 59

## 2019-06-06 ENCOUNTER — Other Ambulatory Visit: Payer: Self-pay

## 2019-06-06 ENCOUNTER — Encounter: Payer: Self-pay | Admitting: Emergency Medicine

## 2019-06-06 DIAGNOSIS — Z79890 Hormone replacement therapy (postmenopausal): Secondary | ICD-10-CM | POA: Insufficient documentation

## 2019-06-06 DIAGNOSIS — K219 Gastro-esophageal reflux disease without esophagitis: Secondary | ICD-10-CM | POA: Diagnosis not present

## 2019-06-06 DIAGNOSIS — M797 Fibromyalgia: Secondary | ICD-10-CM | POA: Diagnosis not present

## 2019-06-06 DIAGNOSIS — Z79899 Other long term (current) drug therapy: Secondary | ICD-10-CM | POA: Diagnosis not present

## 2019-06-06 DIAGNOSIS — E119 Type 2 diabetes mellitus without complications: Secondary | ICD-10-CM | POA: Diagnosis not present

## 2019-06-06 DIAGNOSIS — Z881 Allergy status to other antibiotic agents status: Secondary | ICD-10-CM | POA: Diagnosis not present

## 2019-06-06 DIAGNOSIS — Z20828 Contact with and (suspected) exposure to other viral communicable diseases: Secondary | ICD-10-CM | POA: Diagnosis not present

## 2019-06-06 DIAGNOSIS — M6281 Muscle weakness (generalized): Secondary | ICD-10-CM | POA: Diagnosis not present

## 2019-06-06 DIAGNOSIS — F419 Anxiety disorder, unspecified: Secondary | ICD-10-CM | POA: Insufficient documentation

## 2019-06-06 DIAGNOSIS — Z7982 Long term (current) use of aspirin: Secondary | ICD-10-CM | POA: Insufficient documentation

## 2019-06-06 DIAGNOSIS — E785 Hyperlipidemia, unspecified: Secondary | ICD-10-CM | POA: Insufficient documentation

## 2019-06-06 DIAGNOSIS — Z880 Allergy status to penicillin: Secondary | ICD-10-CM | POA: Insufficient documentation

## 2019-06-06 DIAGNOSIS — E039 Hypothyroidism, unspecified: Secondary | ICD-10-CM | POA: Diagnosis not present

## 2019-06-06 DIAGNOSIS — R42 Dizziness and giddiness: Secondary | ICD-10-CM

## 2019-06-06 DIAGNOSIS — M542 Cervicalgia: Secondary | ICD-10-CM

## 2019-06-06 DIAGNOSIS — I639 Cerebral infarction, unspecified: Principal | ICD-10-CM | POA: Insufficient documentation

## 2019-06-06 HISTORY — DX: Type 2 diabetes mellitus without complications: E11.9

## 2019-06-06 LAB — COMPREHENSIVE METABOLIC PANEL
ALT: 47 U/L — ABNORMAL HIGH (ref 0–44)
AST: 18 U/L (ref 15–41)
Albumin: 4.6 g/dL (ref 3.5–5.0)
Alkaline Phosphatase: 91 U/L (ref 38–126)
Anion gap: 13 (ref 5–15)
BUN: 12 mg/dL (ref 6–20)
CO2: 23 mmol/L (ref 22–32)
Calcium: 9.4 mg/dL (ref 8.9–10.3)
Chloride: 104 mmol/L (ref 98–111)
Creatinine, Ser: 0.65 mg/dL (ref 0.44–1.00)
GFR calc Af Amer: >60 mL/min (ref >60)
GFR calc non Af Amer: >60 mL/min (ref >60)
Glucose, Bld: 95 mg/dL (ref 70–99)
Potassium: 4.1 mmol/L (ref 3.5–5.1)
Sodium: 140 mmol/L (ref 135–145)
Total Bilirubin: 0.8 mg/dL (ref 0.3–1.2)
Total Protein: 7.3 g/dL (ref 6.5–8.1)

## 2019-06-06 LAB — DIFFERENTIAL
Abs Immature Granulocytes: 0.03 10*3/uL (ref 0.00–0.07)
Basophils Absolute: 0.1 10*3/uL (ref 0.0–0.1)
Basophils Relative: 1 %
Eosinophils Absolute: 0.1 10*3/uL (ref 0.0–0.5)
Eosinophils Relative: 1 %
Immature Granulocytes: 0 %
Lymphocytes Relative: 25 %
Lymphs Abs: 2.4 10*3/uL (ref 0.7–4.0)
Monocytes Absolute: 0.5 10*3/uL (ref 0.1–1.0)
Monocytes Relative: 5 %
Neutro Abs: 6.5 10*3/uL (ref 1.7–7.7)
Neutrophils Relative %: 68 %

## 2019-06-06 LAB — CBC
HCT: 47.5 % — ABNORMAL HIGH (ref 36.0–46.0)
Hemoglobin: 15.6 g/dL — ABNORMAL HIGH (ref 12.0–15.0)
MCH: 28.5 pg (ref 26.0–34.0)
MCHC: 32.8 g/dL (ref 30.0–36.0)
MCV: 86.7 fL (ref 80.0–100.0)
Platelets: 279 10*3/uL (ref 150–400)
RBC: 5.48 MIL/uL — ABNORMAL HIGH (ref 3.87–5.11)
RDW: 13.8 % (ref 11.5–15.5)
WBC: 9.7 10*3/uL (ref 4.0–10.5)
nRBC: 0 % (ref 0.0–0.2)

## 2019-06-06 LAB — PROTIME-INR
INR: 1 (ref 0.8–1.2)
Prothrombin Time: 12.6 seconds (ref 11.4–15.2)

## 2019-06-06 LAB — GLUCOSE, CAPILLARY: Glucose-Capillary: 102 mg/dL — ABNORMAL HIGH (ref 70–99)

## 2019-06-06 LAB — APTT: aPTT: 29 seconds (ref 24–36)

## 2019-06-06 MED ORDER — ASPIRIN EC 81 MG PO TBEC
81.0000 mg | DELAYED_RELEASE_TABLET | Freq: Every day | ORAL | Status: DC
  Administered 2019-06-06 – 2019-06-07 (×2): 81 mg via ORAL
  Filled 2019-06-06 (×2): qty 1

## 2019-06-06 MED ORDER — GABAPENTIN 100 MG PO CAPS
100.0000 mg | ORAL_CAPSULE | Freq: Three times a day (TID) | ORAL | Status: DC
  Administered 2019-06-06 – 2019-06-07 (×2): 100 mg via ORAL
  Filled 2019-06-06 (×2): qty 1

## 2019-06-06 MED ORDER — ACETAMINOPHEN 325 MG PO TABS
650.0000 mg | ORAL_TABLET | ORAL | Status: DC | PRN
  Administered 2019-06-07: 650 mg via ORAL
  Filled 2019-06-06: qty 2

## 2019-06-06 MED ORDER — SIMVASTATIN 10 MG PO TABS
40.0000 mg | ORAL_TABLET | Freq: Every day | ORAL | Status: DC
  Administered 2019-06-07: 09:00:00 40 mg via ORAL
  Filled 2019-06-06 (×2): qty 4

## 2019-06-06 MED ORDER — SENNOSIDES-DOCUSATE SODIUM 8.6-50 MG PO TABS: 1.0000 | ORAL_TABLET | Freq: Every evening | ORAL | Status: DC | PRN

## 2019-06-06 MED ORDER — MECLIZINE HCL 25 MG PO TABS
25.0000 mg | ORAL_TABLET | Freq: Three times a day (TID) | ORAL | Status: DC
  Administered 2019-06-06 – 2019-06-07 (×3): 25 mg via ORAL
  Filled 2019-06-06 (×6): qty 1

## 2019-06-06 MED ORDER — DICLOFENAC SODIUM 75 MG PO TBEC
75.0000 mg | DELAYED_RELEASE_TABLET | Freq: Two times a day (BID) | ORAL | Status: DC
  Administered 2019-06-07: 75 mg via ORAL
  Filled 2019-06-06 (×4): qty 1

## 2019-06-06 MED ORDER — TRAZODONE HCL 50 MG PO TABS
50.0000 mg | ORAL_TABLET | Freq: Every day | ORAL | Status: DC
  Administered 2019-06-06: 50 mg via ORAL
  Filled 2019-06-06: qty 1

## 2019-06-06 MED ORDER — DULOXETINE HCL 30 MG PO CPEP
60.0000 mg | ORAL_CAPSULE | Freq: Every day | ORAL | Status: DC
  Administered 2019-06-07: 01:00:00 60 mg via ORAL
  Filled 2019-06-06: qty 2

## 2019-06-06 MED ORDER — STROKE: EARLY STAGES OF RECOVERY BOOK
Freq: Once | Status: AC
  Administered 2019-06-06: 19:00:00

## 2019-06-06 MED ORDER — TRAMADOL HCL 50 MG PO TABS: 50.0000 mg | ORAL_TABLET | Freq: Two times a day (BID) | ORAL | Status: DC | PRN

## 2019-06-06 MED ORDER — SODIUM CHLORIDE 0.9 % IV SOLN
INTRAVENOUS | Status: DC
  Administered 2019-06-06 – 2019-06-07 (×2): via INTRAVENOUS

## 2019-06-06 MED ORDER — INSULIN ASPART 100 UNIT/ML ~~LOC~~ SOLN: 0.0000 [IU] | Freq: Every day | SUBCUTANEOUS | Status: DC

## 2019-06-06 MED ORDER — DULOXETINE HCL 60 MG PO CPEP: 60.0000 mg | ORAL_CAPSULE | Freq: Every day | ORAL | Status: DC

## 2019-06-06 MED ORDER — ENOXAPARIN SODIUM 40 MG/0.4ML ~~LOC~~ SOLN
40.0000 mg | SUBCUTANEOUS | Status: DC
  Administered 2019-06-06: 40 mg via SUBCUTANEOUS
  Filled 2019-06-06: qty 0.4

## 2019-06-06 MED ORDER — LEVOTHYROXINE SODIUM 50 MCG PO TABS
25.0000 ug | ORAL_TABLET | Freq: Every day | ORAL | Status: DC
  Administered 2019-06-07: 06:00:00 25 ug via ORAL
  Filled 2019-06-06: qty 1

## 2019-06-06 MED ORDER — ACETAMINOPHEN 160 MG/5ML PO SOLN
650.0000 mg | ORAL | Status: DC | PRN
  Filled 2019-06-06: qty 20.3

## 2019-06-06 MED ORDER — IOHEXOL 350 MG/ML SOLN
75.0000 mL | Freq: Once | INTRAVENOUS | Status: AC | PRN
  Administered 2019-06-06: 15:00:00 75 mL via INTRAVENOUS

## 2019-06-06 MED ORDER — ACETAMINOPHEN 650 MG RE SUPP: 650.0000 mg | RECTAL | Status: DC | PRN

## 2019-06-06 MED ORDER — INSULIN ASPART 100 UNIT/ML ~~LOC~~ SOLN: 0.0000 [IU] | Freq: Three times a day (TID) | SUBCUTANEOUS | Status: DC

## 2019-06-06 MED ORDER — VITAMIN D (ERGOCALCIFEROL) 1.25 MG (50000 UNIT) PO CAPS
50000.0000 [IU] | ORAL_CAPSULE | ORAL | Status: DC
  Administered 2019-06-07: 09:00:00 50000 [IU] via ORAL
  Filled 2019-06-06: qty 1

## 2019-06-06 NOTE — ED Notes (Signed)
MRI screening completed with patient.

## 2019-06-06 NOTE — Consult Note (Signed)
Referring Physician: Darnelle CatalanMalinda    Chief Complaint: Headache  HPI: Michaela Caldwell is an 48 y.o. female with a history of aneurysm repair and migraine who reports that she has been doing well with her migraines since her hysterectomy this year.  On yesterday began to have a headache that was frontal and behind the right eye.  When she awakened today that headache was gone.  About 1100 she had return of a headache that is now holocranial and pressure-like.  She also feels vertiginous.  No associated numbness or weakness.  Although headache beginning to improve she continues to be vertiginous.  Initial NIHSS of 0.   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  No associated hemorrhage.    Date last known well: Date: 06/06/2019 Time last known well: Time: 11:00 tPA Given: No: No disabling symptoms  Past Medical History:  Diagnosis Date  . Anxiety   . Diabetes mellitus without complication (HCC)   . Elevated lipids   . Fibromyalgia   . GERD (gastroesophageal reflux disease)   . Headache   . Hypothyroidism   . Shoulder pain, right    elbow pain    Past Surgical History:  Procedure Laterality Date  . CESAREAN SECTION    . dislocated jaw    . EXCISION/RELEASE BURSA HIP    . KNEE ARTHROSCOPY Right   . LAPAROSCOPIC VAGINAL HYSTERECTOMY WITH SALPINGO OOPHORECTOMY Bilateral 10/21/2018   Procedure: LAPAROSCOPIC ASSISTED VAGINAL HYSTERECTOMY WITH SALPINGO OOPHORECTOMY;  Surgeon: Schermerhorn, Ihor Austinhomas J, MD;  Location: [facility] ORS;  Service: Gynecology;  Laterality: Bilateral;  . SHOULDER ARTHROSCOPY Right   . uterine ablation      Family history: Father with dementia and stroke.  Mother with HTN, HLD and ovarian cancer.    Social History:  reports that she quit smoking about 10 months ago. She has never used smokeless tobacco. She reports that she does not drink alcohol or use drugs.  Allergies:  Allergies  Allergen Reactions  . Augmentin [Amoxicillin-Pot  Clavulanate]     Diarrhea     Medications: I have reviewed the patient's current medications. Prior to Admission medications   Medication Sig Start Date End Date Taking? Authorizing Provider  cyclobenzaprine (FLEXERIL) 5 MG tablet Take 5 mg by mouth 3 (three) times daily as needed for muscle spasms.    [provider]  DULoxetine (CYMBALTA) 30 MG capsule Take 30 mg by mouth daily.    [provider]  gabapentin (NEURONTIN) 100 MG capsule Take 100 mg by mouth 2 (two) times daily.    [provider]  levothyroxine (SYNTHROID, LEVOTHROID) 25 MCG tablet Take 25 mcg by mouth daily before breakfast.    [provider]  omeprazole (PRILOSEC) 20 MG capsule Take 20 mg by mouth daily.    [provider]  simvastatin (ZOCOR) 40 MG tablet Take 40 mg by mouth daily.    [provider]  SUMAtriptan (IMITREX) 20 MG/ACT nasal spray Place 20 mg into the nose every 2 (two) hours as needed for migraine or headache. May repeat in 2 hours if headache persists or recurs.    [provider]  Vitamin D, Ergocalciferol, (DRISDOL) 1.25 MG (50000 UT) CAPS capsule Take 50,000 Units by mouth every 7 (seven) days.    [provider]    ROS: History obtained from the patient  General ROS: negative for - chills, fatigue, fever, night sweats, weight gain or weight loss Psychological ROS: negative for -  behavioral disorder, hallucinations, memory difficulties, mood swings or suicidal ideation Ophthalmic ROS: negative for - blurry vision, double vision, eye pain or loss of vision ENT ROS: vertigo Allergy and Immunology ROS: negative for - hives or itchy/watery eyes Hematological and Lymphatic ROS: negative for - bleeding problems, bruising or swollen lymph nodes Endocrine ROS: negative for - galactorrhea, hair pattern changes, polydipsia/polyuria or temperature intolerance Respiratory ROS: negative for - cough, hemoptysis, shortness of breath or  wheezing Cardiovascular ROS: negative for - chest pain, dyspnea on exertion, edema or irregular heartbeat Gastrointestinal ROS: negative for - abdominal pain, diarrhea, hematemesis, nausea/vomiting or stool incontinence Genito-Urinary ROS: negative for - dysuria, hematuria, incontinence or urinary frequency/urgency Musculoskeletal ROS: negative for - joint swelling or muscular weakness Neurological ROS: as noted in HPI Dermatological ROS: negative for rash and skin lesion changes  Physical Examination: Blood pressure 126/88, pulse (!) 58, temperature 98 F (36.7 C), temperature source Oral, resp. rate 15, height  (1.676 m), weight 97.5 kg, last menstrual period 10/15/2013, SpO2 97 %.  HEENT-  Normocephalic, no lesions, without obvious abnormality.  Normal external eye and conjunctiva.  Normal TM's bilaterally.  Normal auditory canals and external ears. Normal external nose, mucus membranes and septum.  Normal pharynx. Cardiovascular- S1, S2 normal, pulses palpable throughout   Lungs- chest clear, no wheezing, rales, normal symmetric air entry Abdomen- soft, non-tender; bowel sounds normal; no masses,  no organomegaly Extremities- no edema Lymph-no adenopathy palpable Musculoskeletal-no joint tenderness, deformity or swelling Skin-warm and dry, no hyperpigmentation, vitiligo, or suspicious lesions  Neurological Examination   Mental Status: Alert, oriented, thought content appropriate.  Speech fluent without evidence of aphasia.  Able to follow 3 step commands without difficulty. Cranial Nerves: II: Discs flat bilaterally; Visual fields grossly normal, pupils equal, round, reactive to light and accommodation III,IV, VI: ptosis not present, extra-ocular motions intact bilaterally V,VII: smile symmetric, facial light touch sensation normal bilaterally VIII: hearing normal bilaterally IX,X: gag reflex present XI: bilateral shoulder shrug XII: midline tongue extension Motor: Right  : Upper extremity   5/5    Left:     Upper extremity   5/5  Lower extremity   5/5     Lower extremity   5/5 Tone and bulk:normal tone throughout; no atrophy noted Sensory: Pinprick and light touch intact throughout, bilaterally Deep Tendon Reflexes: Symmetric throughout Plantars: Right: mute   Left: mute Cerebellar: Normal finger-to-nose and normal heel-to-shin testing bilaterally Gait: not tested due to safety concerns    Laboratory Studies:  Basic Metabolic Panel: Recent Labs  Lab 06/06/19 1233  NA 140  K 4.1  CL 104  CO2 23  GLUCOSE 95  BUN 12  CREATININE 0.65  CALCIUM 9.4    Liver Function Tests: Recent Labs  Lab 06/06/19 1233  AST 18  ALT 47*  ALKPHOS 91  BILITOT 0.8  PROT 7.3  ALBUMIN 4.6   No results for input(s): LIPASE, AMYLASE in the last 168 hours. No results for input(s): AMMONIA in the last 168 hours.  CBC: Recent Labs  Lab 06/06/19 1233  WBC 9.7  NEUTROABS 6.5  HGB 15.6*  HCT 47.5*  MCV 86.7  PLT 279    Cardiac Enzymes: No results for input(s): CKTOTAL, CKMB, CKMBINDEX, TROPONINI in the last 168 hours.  BNP: Invalid input(s): POCBNP  CBG: Recent Labs  Lab 06/06/19 1226  GLUCAP 102*    Microbiology: No results found for this or any previous visit.  Coagulation Studies: Recent Labs    06/06/19 1233  LABPROT 12.6  INR 1.0    Urinalysis: No results for input(s): COLORURINE, LABSPEC, PHURINE, GLUCOSEU, HGBUR, BILIRUBINUR, KETONESUR, PROTEINUR, UROBILINOGEN, NITRITE, LEUKOCYTESUR in the last 168 hours.  Invalid input(s): APPERANCEUR  Lipid Panel: No results found for: CHOL, TRIG, HDL, CHOLHDL, VLDL, LDLCALC  HgbA1C: No results found for: HGBA1C  Urine Drug Screen:  No results found for: LABOPIA, COCAINSCRNUR, LABBENZ, AMPHETMU, THCU, LABBARB  Alcohol Level: No results for input(s): ETH in the last 168 hours.  Other results: EKG:  normal sinus rhythm at 68 bpm.  Imaging: Ct Angio Head W Or Wo Contrast  Result Date:  06/06/2019 CLINICAL DATA:  Severe headache.  History of aneurysm coiling. EXAM: CT ANGIOGRAPHY HEAD AND NECK TECHNIQUE: Multidetector CT imaging of the head and neck was performed using the standard protocol during bolus administration of intravenous contrast. Multiplanar CT image reconstructions and MIPs were obtained to evaluate the vascular anatomy. Carotid stenosis measurements (when applicable) are obtained utilizing NASCET criteria, using the distal internal carotid diameter as the denominator. CONTRAST:  32mL OMNIPAQUE IOHEXOL 350 MG/ML SOLN COMPARISON:  CT head 06/06/2019.  CTA head 11/12/2018 FINDINGS: CTA NECK FINDINGS Aortic arch: Standard branching. Imaged portion shows no evidence of aneurysm or dissection. No significant stenosis of the major arch vessel origins. Right carotid system: Carotid bifurcation widely patent. Negative for atherosclerotic disease or dissection Left carotid system: Carotid bifurcation widely patent. Negative for atherosclerotic disease or dissection. Vertebral arteries: Both vertebral arteries are normal and widely patent. Skeleton: Negative Other neck: Negative for mass or adenopathy. Upper chest: Multiple tiny calcified nodules right upper lobe compatible with granulomatous disease. No acute abnormality. Review of the MIP images confirms the above findings CTA HEAD FINDINGS Anterior circulation: Right cavernous carotid widely patent. Right anterior and middle cerebral arteries widely patent. Left cavernous carotid widely patent. 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 No recurrent aneurysm. Left anterior and middle cerebral arteries widely patent. Negative for aneurysm Posterior circulation: Both vertebral arteries widely patent. PICA patent bilaterally. AICA, superior cerebellar, posterior cerebral arteries widely patent bilaterally. Negative for stenosis or aneurysm. Venous sinuses: Normal venous enhancement. Right transverse sinus dominant. Anatomic  variants: None Review of the MIP images confirms the above findings IMPRESSION: 1. 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 No recurrent aneurysm. No acute hemorrhage on CT earlier today 2. No significant intracranial or extracranial stenosis 3. Negative for cerebral aneurysm. Electronically Signed   By: Marlan Palau M.D.   On: 06/06/2019 14:53   Ct Head Wo Contrast  Result Date: 06/06/2019 CLINICAL DATA:  Focal neuro deficit, greater than 6 hours, stroke suspected. Additional history: Dizziness that started yesterday afternoon. EXAM: CT HEAD WITHOUT CONTRAST TECHNIQUE: Contiguous axial images were obtained from the base of the skull through the vertex without intravenous contrast. COMPARISON:  CT angiogram head 11/12/2018 FINDINGS: Brain: No evidence of acute intracranial hemorrhage. No demarcated cortical infarction. No evidence of intracranial mass. No midline shift or extra-axial fluid collection. Cerebral volume is normal for age. Partially empty sella turcica. Vascular: Streak artifact arising from redemonstrated left ICA coil embolization pack. No hyperdense vessel is seen. Skull: Normal. Negative for fracture or focal lesion. Sinuses/Orbits: Visualized orbits demonstrate no acute abnormality. Visualized paranasal sinuses are clear. Visualized mastoid air cells are clear. IMPRESSION: No CT evidence of acute intracranial abnormality. Redemonstrated left ICA coil embolization material with associated streak artifact. Electronically Signed   By: Jackey Loge   On: 06/06/2019 12:59   Ct Angio Neck W And/or Wo Contrast  Result  Date: 06/06/2019 CLINICAL DATA:  Severe headache.  History of aneurysm coiling. EXAM: CT ANGIOGRAPHY HEAD AND NECK TECHNIQUE: Multidetector CT imaging of the head and neck was performed using the standard protocol during bolus administration of intravenous contrast. Multiplanar CT image reconstructions and MIPs were obtained to evaluate the vascular anatomy. Carotid  stenosis measurements (when applicable) are obtained utilizing NASCET criteria, using the distal internal carotid diameter as the denominator. CONTRAST:  37mL OMNIPAQUE IOHEXOL 350 MG/ML SOLN COMPARISON:  CT head 06/06/2019.  CTA head 11/12/2018 FINDINGS: CTA NECK FINDINGS Aortic arch: Standard branching. Imaged portion shows no evidence of aneurysm or dissection. No significant stenosis of the major arch vessel origins. Right carotid system: Carotid bifurcation widely patent. Negative for atherosclerotic disease or dissection Left carotid system: Carotid bifurcation widely patent. Negative for atherosclerotic disease or dissection. Vertebral arteries: Both vertebral arteries are normal and widely patent. Skeleton: Negative Other neck: Negative for mass or adenopathy. Upper chest: Multiple tiny calcified nodules right upper lobe compatible with granulomatous disease. No acute abnormality. Review of the MIP images confirms the above findings CTA HEAD FINDINGS Anterior circulation: Right cavernous carotid widely patent. Right anterior and middle cerebral arteries widely patent. Left cavernous carotid widely patent. 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 No recurrent aneurysm. Left anterior and middle cerebral arteries widely patent. Negative for aneurysm Posterior circulation: Both vertebral arteries widely patent. PICA patent bilaterally. AICA, superior cerebellar, posterior cerebral arteries widely patent bilaterally. Negative for stenosis or aneurysm. Venous sinuses: Normal venous enhancement. Right transverse sinus dominant. Anatomic variants: None Review of the MIP images confirms the above findings IMPRESSION: 1. 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 No recurrent aneurysm. No acute hemorrhage on CT earlier today 2. No significant intracranial or extracranial stenosis 3. Negative for cerebral aneurysm. Electronically Signed   By: Franchot Gallo M.D.   On: 06/06/2019 14:53     Assessment: 48 y.o. female presenting with headache that is atypical for her usual migraine.  Although her migraines may be changing in characteristics due to her recent hysterectomy will rule out other etiologies including acute infarct.  Head CT reviewed and shows no acute changes.  CTA shows coiling but no new aneurysm or evidence of thrombosis.  Although patient has had multiple MRI's since her coiling further investigation required before further imaging done here.    Stroke Risk Factors - diabetes mellitus and hyperlipidemia  Plan: 1. HgbA1c, fasting lipid panel, TSH, B12 2. MRI of the brain without contrast once cleared 3. ASA 81mg  daily 4. Meclizine prn dizziness 5. Telemetry monitoring 6. Frequent neuro checks  Case discussed with Dr. Willette Cluster, MD Neurology [phone] 06/06/2019, 3:04 PM

## 2019-06-06 NOTE — ED Triage Notes (Signed)
Pt presents to ED via POV with c/o dizziness that started yesterday afternoon at approx 1400. Pt states she feels like the top of her body is heavy and feels like she is going to fall over, pt states she feels like she has had trouble "getting her words right". Pt also c/o HA behind her L eye. Pt states symptoms started yesterday at 1400, symptoms have not resolved but have progressively worsened since then. Pt with equal grip strength, no drift noted to arms or legs, facial symmetry intact, no slurred speech noted at this time, A&O x 4, able to answer all questions appropriately. Pt denies numbness when touched on face, arms, and legs.

## 2019-06-06 NOTE — ED Notes (Signed)
Pt back CT.

## 2019-06-06 NOTE — ED Notes (Signed)
Pt given drink. [place] per MD

## 2019-06-06 NOTE — ED Provider Notes (Signed)
[facility] Emergency Department Provider Note   ____________________________________________   First MD Initiated Contact with Patient 06/06/19 1353     (approximate)  I have reviewed the triage vital signs and the nursing notes.   HISTORY  Chief Complaint Dizziness    HPI Michaela Caldwell is a 48 y.o. female who reports a past history of migraines and aneurysms who had severe headache behind the left eye which is coming and going today she also had an episode of word finding difficulty earlier today and still feels like her head is heavy like it wants to fall forward.         Past Medical History:  Diagnosis Date   Anxiety    Diabetes mellitus without complication (HCC)    Elevated lipids    Fibromyalgia    GERD (gastroesophageal reflux disease)    Headache    Hypothyroidism    Shoulder pain, right    elbow pain    Patient Active Problem List   Diagnosis Date Noted   Postoperative state 10/21/2018    Past Surgical History:  Procedure Laterality Date   CESAREAN SECTION     dislocated jaw     EXCISION/RELEASE BURSA HIP     KNEE ARTHROSCOPY Right    LAPAROSCOPIC VAGINAL HYSTERECTOMY WITH SALPINGO OOPHORECTOMY Bilateral 10/21/2018   Procedure: LAPAROSCOPIC ASSISTED VAGINAL HYSTERECTOMY WITH SALPINGO OOPHORECTOMY;  Surgeon: Schermerhorn, Ihor Austin, MD;  Location: [facility] ORS;  Service: Gynecology;  Laterality: Bilateral;   SHOULDER ARTHROSCOPY Right    uterine ablation      Prior to Admission medications   Medication Sig Start Date End Date Taking? Authorizing Provider  cyclobenzaprine (FLEXERIL) 5 MG tablet Take 5 mg by mouth 3 (three) times daily as needed for muscle spasms.    [provider]  DULoxetine (CYMBALTA) 30 MG capsule Take 30 mg by mouth daily.    [provider]  gabapentin (NEURONTIN) 100 MG capsule Take 100 mg by mouth 2 (two) times daily.    [provider]  levothyroxine  (SYNTHROID, LEVOTHROID) 25 MCG tablet Take 25 mcg by mouth daily before breakfast.    [provider]  omeprazole (PRILOSEC) 20 MG capsule Take 20 mg by mouth daily.    [provider]  simvastatin (ZOCOR) 40 MG tablet Take 40 mg by mouth daily.    [provider]  SUMAtriptan (IMITREX) 20 MG/ACT nasal spray Place 20 mg into the nose every 2 (two) hours as needed for migraine or headache. May repeat in 2 hours if headache persists or recurs.    [provider]  Vitamin D, Ergocalciferol, (DRISDOL) 1.25 MG (50000 UT) CAPS capsule Take 50,000 Units by mouth every 7 (seven) days.    [provider]    Allergies Augmentin [amoxicillin-pot clavulanate]  No family history on file.  Social History Social History   Tobacco Use   Smoking status: Former Smoker    Quit date: 07/15/2018    Years since quitting: 0.8   Smokeless tobacco: Never Used  Substance Use Topics   Alcohol use: Never    Frequency: Never   Drug use: Never    Review of Systems  Constitutional: No fever/chills Eyes: No visual changes. ENT: No sore throat. Cardiovascular: Denies chest pain. Respiratory: Denies shortness of breath. Gastrointestinal: No abdominal pain.  No nausea, no vomiting.  No diarrhea.  No constipation. Genitourinary: Negative for dysuria. Musculoskeletal: Negative for back pain. Skin: Negative for rash. Neurological: Negative for headaches, focal weakness  ____________________________________________   PHYSICAL EXAM:  VITAL SIGNS: ED Triage Vitals  Enc Vitals Group     BP 06/06/19 1216 (!) 152/87     Pulse Rate 06/06/19 1216 69     Resp 06/06/19 1216 18     Temp 06/06/19 1216 98 F (36.7 C)     Temp Source 06/06/19 1216 Oral     SpO2 06/06/19 1216 96 %     Weight 06/06/19 1216 215 lb (97.5 kg)     Height 06/06/19 1216 5\' 6"  (1.676 m)     Head Circumference --      Peak Flow --      Pain Score 06/06/19 1226 4     Pain Loc --      Pain  Edu? --      Excl. in GC? --     Constitutional: Alert and oriented. Well appearing and in no acute distress. Eyes: Conjunctivae are normal. PERRL. EOMI. fundi appear normal Head: Atraumatic. Nose: No congestion/rhinnorhea. Mouth/Throat: Mucous membranes are moist.  Oropharynx non-erythematous. Neck: No stridor.   Cardiovascular: Normal rate, regular rhythm. Grossly normal heart sounds.  Good peripheral circulation. Respiratory: Normal respiratory effort.  No retractions. Lungs CTAB. Gastrointestinal: Soft and nontender. No distention. No abdominal bruits. No CVA tenderness. Musculoskeletal: No lower extremity tenderness nor edema.  No joint effusions. Neurologic:  Normal speech and language. No gross focal neurologic deficits are appreciated. No gait instability. Skin:  Skin is warm, dry and intact. No rash noted. Psychiatric: Mood and affect are normal. Speech and behavior are normal.  ____________________________________________   LABS (all labs ordered are listed, but only abnormal results are displayed)  Labs Reviewed  GLUCOSE, CAPILLARY - Abnormal; Notable for the following components:      Result Value   Glucose-Capillary 102 (*)    All other components within normal limits  CBC - Abnormal; Notable for the following components:   RBC 5.48 (*)    Hemoglobin 15.6 (*)    HCT 47.5 (*)    All other components within normal limits  COMPREHENSIVE METABOLIC PANEL - Abnormal; Notable for the following components:   ALT 47 (*)    All other components within normal limits  SARS CORONAVIRUS 2 (TAT 6-24 HRS)  PROTIME-INR  APTT  DIFFERENTIAL  CBG MONITORING, ED   ____________________________________________  EKG   ____________________________________________  RADIOLOGY  ED MD interpretation: CT of head read by radiology reviewed by me is negative for acute findings  Official radiology report(s): Ct Angio Head W Or Wo Contrast  Result Date: 06/06/2019 CLINICAL DATA:   Severe headache.  History of aneurysm coiling. EXAM: CT ANGIOGRAPHY HEAD AND NECK TECHNIQUE: Multidetector CT imaging of the head and neck was performed using the standard protocol during bolus administration of intravenous contrast. Multiplanar CT image reconstructions and MIPs were obtained to evaluate the vascular anatomy. Carotid stenosis measurements (when applicable) are obtained utilizing NASCET criteria, using the distal internal carotid diameter as the denominator. CONTRAST:  75mL OMNIPAQUE IOHEXOL 350 MG/ML SOLN COMPARISON:  CT head 06/06/2019.  CTA head 11/12/2018 FINDINGS: CTA NECK FINDINGS Aortic arch: Standard branching. Imaged portion shows no evidence of aneurysm or dissection. No significant stenosis of the major arch vessel origins. Right carotid system: Carotid bifurcation widely patent. Negative for atherosclerotic disease or dissection Left carotid system: Carotid bifurcation widely patent. Negative for atherosclerotic disease or dissection. Vertebral arteries: Both vertebral arteries are normal and widely patent. Skeleton: Negative Other neck: Negative for mass or adenopathy. Upper chest: Multiple tiny calcified  nodules right upper lobe compatible with granulomatous disease. No acute abnormality. Review of the MIP images confirms the above findings CTA HEAD FINDINGS Anterior circulation: Right cavernous carotid widely patent. Right anterior and middle cerebral arteries widely patent. Left cavernous carotid widely patent. 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 No recurrent aneurysm. Left anterior and middle cerebral arteries widely patent. Negative for aneurysm Posterior circulation: Both vertebral arteries widely patent. PICA patent bilaterally. AICA, superior cerebellar, posterior cerebral arteries widely patent bilaterally. Negative for stenosis or aneurysm. Venous sinuses: Normal venous enhancement. Right transverse sinus dominant. Anatomic variants: None Review of  the MIP images confirms the above findings IMPRESSION: 1. 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 No recurrent aneurysm. No acute hemorrhage on CT earlier today 2. No significant intracranial or extracranial stenosis 3. Negative for cerebral aneurysm. Electronically Signed   By: Marlan Palau M.D.   On: 06/06/2019 14:53   Ct Head Wo Contrast  Result Date: 06/06/2019 CLINICAL DATA:  Focal neuro deficit, greater than 6 hours, stroke suspected. Additional history: Dizziness that started yesterday afternoon. EXAM: CT HEAD WITHOUT CONTRAST TECHNIQUE: Contiguous axial images were obtained from the base of the skull through the vertex without intravenous contrast. COMPARISON:  CT angiogram head 11/12/2018 FINDINGS: Brain: No evidence of acute intracranial hemorrhage. No demarcated cortical infarction. No evidence of intracranial mass. No midline shift or extra-axial fluid collection. Cerebral volume is normal for age. Partially empty sella turcica. Vascular: Streak artifact arising from redemonstrated left ICA coil embolization pack. No hyperdense vessel is seen. Skull: Normal. Negative for fracture or focal lesion. Sinuses/Orbits: Visualized orbits demonstrate no acute abnormality. Visualized paranasal sinuses are clear. Visualized mastoid air cells are clear. IMPRESSION: No CT evidence of acute intracranial abnormality. Redemonstrated left ICA coil embolization material with associated streak artifact. Electronically Signed   By: Jackey Loge   On: 06/06/2019 12:59   Ct Angio Neck W And/or Wo Contrast  Result Date: 06/06/2019 CLINICAL DATA:  Severe headache.  History of aneurysm coiling. EXAM: CT ANGIOGRAPHY HEAD AND NECK TECHNIQUE: Multidetector CT imaging of the head and neck was performed using the standard protocol during bolus administration of intravenous contrast. Multiplanar CT image reconstructions and MIPs were obtained to evaluate the vascular anatomy. Carotid stenosis measurements (when  applicable) are obtained utilizing NASCET criteria, using the distal internal carotid diameter as the denominator. CONTRAST:  90mL OMNIPAQUE IOHEXOL 350 MG/ML SOLN COMPARISON:  CT head 06/06/2019.  CTA head 11/12/2018 FINDINGS: CTA NECK FINDINGS Aortic arch: Standard branching. Imaged portion shows no evidence of aneurysm or dissection. No significant stenosis of the major arch vessel origins. Right carotid system: Carotid bifurcation widely patent. Negative for atherosclerotic disease or dissection Left carotid system: Carotid bifurcation widely patent. Negative for atherosclerotic disease or dissection. Vertebral arteries: Both vertebral arteries are normal and widely patent. Skeleton: Negative Other neck: Negative for mass or adenopathy. Upper chest: Multiple tiny calcified nodules right upper lobe compatible with granulomatous disease. No acute abnormality. Review of the MIP images confirms the above findings CTA HEAD FINDINGS Anterior circulation: Right cavernous carotid widely patent. Right anterior and middle cerebral arteries widely patent. Left cavernous carotid widely patent. 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 No recurrent aneurysm. Left anterior and middle cerebral arteries widely patent. Negative for aneurysm Posterior circulation: Both vertebral arteries widely patent. PICA patent bilaterally. AICA, superior cerebellar, posterior cerebral arteries widely patent bilaterally. Negative for stenosis or aneurysm. Venous sinuses: Normal venous enhancement. Right transverse sinus dominant. Anatomic variants: None Review of  the MIP images confirms the above findings IMPRESSION: 1. 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 No recurrent aneurysm. No acute hemorrhage on CT earlier today 2. No significant intracranial or extracranial stenosis 3. Negative for cerebral aneurysm. Electronically Signed   By: Franchot Gallo M.D.   On: 06/06/2019 14:53     ____________________________________________   PROCEDURES  Procedure(s) performed (including Critical Care):  Procedures   ____________________________________________   INITIAL IMPRESSION / ASSESSMENT AND PLAN / ED COURSE  ZAKAYLA MARTINEC was evaluated in Emergency Department on 06/06/2019 for the symptoms described in the history of present illness. She was evaluated in the context of the global COVID-19 pandemic, which necessitated consideration that the patient might be at risk for infection with the SARS-CoV-2 virus that causes COVID-19. Institutional protocols and algorithms that pertain to the evaluation of patients at risk for COVID-19 are in a state of rapid change based on information released by regulatory bodies including the CDC and federal and state organizations. These policies and algorithms were followed during the patient's care in the ED.     Discussed patient briefly with Dr. Doy Mince neurology who will see the patient in a few minutes.  CT does not show any acute hemorrhage.  We will try to get an MRI.   Discussed patient with Dr. Doy Mince neurology.  She recommends admission further evaluation before likely discharge some of the patient's symptoms were strokelike or TIA like.      ____________________________________________   FINAL CLINICAL IMPRESSION(S) / ED DIAGNOSES  Final diagnoses:  Cerebrovascular accident (CVA), unspecified mechanism ([place])  Actual diagnosis of strokelike symptoms  ED Discharge Orders    None       Note:  This document was prepared using Dragon voice recognition software and may include unintentional dictation errors.    Nena Polio, MD 06/06/19 [phone]

## 2019-06-06 NOTE — ED Notes (Signed)
Patient transported to CT 

## 2019-06-06 NOTE — H&P (Addendum)
[facility] [place] [facility]   PATIENT NAME: Michaela Caldwell    MR#:  [id number]  DATE OF BIRTH:  02/03/1971  DATE OF ADMISSION:  06/06/2019  PRIMARY CARE PHYSICIAN: Leotis ShamesSingh, Jasmine, MD   REQUESTING/REFERRING PHYSICIAN: Dorothea GlassmanPaul [name], MD  CHIEF COMPLAINT:   Chief Complaint  Patient presents with  . Dizziness    HISTORY OF PRESENT ILLNESS:  Michaela Caldwell  is a 48 y.o. female with a known history of type 2 diabetes, fibromyalgia, headache, hypothyroidism, anxiety, hx cerebral aneurysm s/p coiling who presented to the ED with dizziness and headaches. She states that she had a bad headache yesterday that felt like "pressure behind her eyes". She also noticed blurred vision in her left eye and had difficulty reading the street signs while she was driving yesterday. This morning, she was dizzy and lightheaded. She felt like she was going to pass out, but she also felt like the room was spinning. She states that she "felt heavy" and "like she was going to fall forwards". She denies any numbness, weakness, or tingling in her arms or legs. No changes in speech.  In the ED, vitals and labs were unremarkable.  CT head was unremarkable.  CTA head/neck with aneurysm coiling in the left peri-ophthalmic artery region.  ED physician discussed with neurology (Dr. Thad Rangereynolds) who recommended admission for possible TIA.  Hospitalists were called for admission.  PAST MEDICAL HISTORY:   Past Medical History:  Diagnosis Date  . Anxiety   . Diabetes mellitus without complication (HCC)   . Elevated lipids   . Fibromyalgia   . GERD (gastroesophageal reflux disease)   . Headache   . Hypothyroidism   . Shoulder pain, right    elbow pain    PAST SURGICAL HISTORY:   Past Surgical History:  Procedure Laterality Date  . CESAREAN SECTION    . dislocated jaw    . EXCISION/RELEASE BURSA HIP    . KNEE ARTHROSCOPY Right   . LAPAROSCOPIC VAGINAL HYSTERECTOMY WITH SALPINGO OOPHORECTOMY  Bilateral 10/21/2018   Procedure: LAPAROSCOPIC ASSISTED VAGINAL HYSTERECTOMY WITH SALPINGO OOPHORECTOMY;  Surgeon: Schermerhorn, Ihor Austinhomas J, MD;  Location: [facility] ORS;  Service: Gynecology;  Laterality: Bilateral;  . SHOULDER ARTHROSCOPY Right   . uterine ablation      SOCIAL HISTORY:   Social History   Tobacco Use  . Smoking status: Former Smoker    Quit date: 07/15/2018    Years since quitting: 0.8  . Smokeless tobacco: Never Used  Substance Use Topics  . Alcohol use: Never    Frequency: Never    FAMILY HISTORY:  No family history on file.  DRUG ALLERGIES:   Allergies  Allergen Reactions  . Augmentin [Amoxicillin-Pot Clavulanate]     Diarrhea     REVIEW OF SYSTEMS:   Review of Systems  Constitutional: Negative for chills and fever.  HENT: Negative for congestion and sore throat.   Eyes: Positive for blurred vision. Negative for double vision and photophobia.  Respiratory: Negative for cough and shortness of breath.   Cardiovascular: Negative for chest pain and palpitations.  Gastrointestinal: Negative for nausea and vomiting.  Genitourinary: Negative for dysuria and urgency.  Musculoskeletal: Negative for back pain and neck pain.  Neurological: Positive for dizziness and headaches. Negative for sensory change, speech change, focal weakness and loss of consciousness.  Psychiatric/Behavioral: Negative for depression. The patient is not nervous/anxious.     MEDICATIONS AT HOME:   Prior to Admission medications   Medication Sig Start Date End  Date Taking? Authorizing Provider  cyclobenzaprine (FLEXERIL) 5 MG tablet Take 5 mg by mouth 3 (three) times daily as needed for muscle spasms.    [provider]  DULoxetine (CYMBALTA) 30 MG capsule Take 30 mg by mouth daily.    [provider]  gabapentin (NEURONTIN) 100 MG capsule Take 100 mg by mouth 2 (two) times daily.    [provider]  levothyroxine (SYNTHROID, LEVOTHROID) 25 MCG tablet Take 25  mcg by mouth daily before breakfast.    [provider]  omeprazole (PRILOSEC) 20 MG capsule Take 20 mg by mouth daily.    [provider]  simvastatin (ZOCOR) 40 MG tablet Take 40 mg by mouth daily.    [provider]  SUMAtriptan (IMITREX) 20 MG/ACT nasal spray Place 20 mg into the nose every 2 (two) hours as needed for migraine or headache. May repeat in 2 hours if headache persists or recurs.    [provider]  Vitamin D, Ergocalciferol, (DRISDOL) 1.25 MG (50000 UT) CAPS capsule Take 50,000 Units by mouth every 7 (seven) days.    [provider]      VITAL SIGNS:  Blood pressure 126/88, pulse (!) 58, temperature 98 F (36.7 C), temperature source Oral, resp. rate 15, height 5\' 6"  (1.676 m), weight 97.5 kg, last menstrual period 10/15/2013, SpO2 97 %.  PHYSICAL EXAMINATION:  Physical Exam  GENERAL:  48 y.o.-year-old patient lying in the bed with no acute distress.  EYES: Pupils equal, round, reactive to light and accommodation. No scleral icterus. Extraocular muscles intact.  HEENT: Head atraumatic, normocephalic. Oropharynx and nasopharynx clear.  NECK:  Supple, no jugular venous distention. No thyroid enlargement, no tenderness.  LUNGS: Normal breath sounds bilaterally, no wheezing, rales,rhonchi or crepitation. No use of accessory muscles of respiration.  CARDIOVASCULAR: RRR, S1, S2 normal. No murmurs, rubs, or gallops.  ABDOMEN: Soft, nontender, nondistended. Bowel sounds present. No organomegaly or mass.  EXTREMITIES: No pedal edema, cyanosis, or clubbing.  NEUROLOGIC: Cranial nerves II through XII are intact. Muscle strength 5/5 in all extremities. Sensation intact. Normal finger-to-nose testing. Gait not checked.  PSYCHIATRIC: The patient is alert and oriented x 3.  SKIN: No obvious rash, lesion, or ulcer.   LABORATORY PANEL:   CBC Recent Labs  Lab 06/06/19 1233  WBC 9.7  HGB 15.6*  HCT 47.5*  PLT 279    ------------------------------------------------------------------------------------------------------------------  Chemistries  Recent Labs  Lab 06/06/19 1233  NA 140  K 4.1  CL 104  CO2 23  GLUCOSE 95  BUN 12  CREATININE 0.65  CALCIUM 9.4  AST 18  ALT 47*  ALKPHOS 91  BILITOT 0.8   ------------------------------------------------------------------------------------------------------------------  Cardiac Enzymes No results for input(s): TROPONINI in the last 168 hours. ------------------------------------------------------------------------------------------------------------------  RADIOLOGY:  Ct Angio Head W Or Wo Contrast  Result Date: 06/06/2019 CLINICAL DATA:  Severe headache.  History of aneurysm coiling. EXAM: CT ANGIOGRAPHY HEAD AND NECK TECHNIQUE: Multidetector CT imaging of the head and neck was performed using the standard protocol during bolus administration of intravenous contrast. Multiplanar CT image reconstructions and MIPs were obtained to evaluate the vascular anatomy. Carotid stenosis measurements (when applicable) are obtained utilizing NASCET criteria, using the distal internal carotid diameter as the denominator. CONTRAST:  91mL OMNIPAQUE IOHEXOL 350 MG/ML SOLN COMPARISON:  CT head 06/06/2019.  CTA head 11/12/2018 FINDINGS: CTA NECK FINDINGS Aortic arch: Standard branching. Imaged portion shows no evidence of aneurysm or dissection. No significant stenosis of the major arch vessel origins. Right carotid system:  Carotid bifurcation widely patent. Negative for atherosclerotic disease or dissection Left carotid system: Carotid bifurcation widely patent. Negative for atherosclerotic disease or dissection. Vertebral arteries: Both vertebral arteries are normal and widely patent. Skeleton: Negative Other neck: Negative for mass or adenopathy. Upper chest: Multiple tiny calcified nodules right upper lobe compatible with granulomatous disease. No acute abnormality. Review  of the MIP images confirms the above findings CTA HEAD FINDINGS Anterior circulation: Right cavernous carotid widely patent. Right anterior and middle cerebral arteries widely patent. Left cavernous carotid widely patent. 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 No recurrent aneurysm. Left anterior and middle cerebral arteries widely patent. Negative for aneurysm Posterior circulation: Both vertebral arteries widely patent. PICA patent bilaterally. AICA, superior cerebellar, posterior cerebral arteries widely patent bilaterally. Negative for stenosis or aneurysm. Venous sinuses: Normal venous enhancement. Right transverse sinus dominant. Anatomic variants: None Review of the MIP images confirms the above findings IMPRESSION: 1. 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 No recurrent aneurysm. No acute hemorrhage on CT earlier today 2. No significant intracranial or extracranial stenosis 3. Negative for cerebral aneurysm. Electronically Signed   By: Marlan Palau M.D.   On: 06/06/2019 14:53   Ct Head Wo Contrast  Result Date: 06/06/2019 CLINICAL DATA:  Focal neuro deficit, greater than 6 hours, stroke suspected. Additional history: Dizziness that started yesterday afternoon. EXAM: CT HEAD WITHOUT CONTRAST TECHNIQUE: Contiguous axial images were obtained from the base of the skull through the vertex without intravenous contrast. COMPARISON:  CT angiogram head 11/12/2018 FINDINGS: Brain: No evidence of acute intracranial hemorrhage. No demarcated cortical infarction. No evidence of intracranial mass. No midline shift or extra-axial fluid collection. Cerebral volume is normal for age. Partially empty sella turcica. Vascular: Streak artifact arising from redemonstrated left ICA coil embolization pack. No hyperdense vessel is seen. Skull: Normal. Negative for fracture or focal lesion. Sinuses/Orbits: Visualized orbits demonstrate no acute abnormality. Visualized paranasal sinuses are  clear. Visualized mastoid air cells are clear. IMPRESSION: No CT evidence of acute intracranial abnormality. Redemonstrated left ICA coil embolization material with associated streak artifact. Electronically Signed   By: Jackey Loge   On: 06/06/2019 12:59   Ct Angio Neck W And/or Wo Contrast  Result Date: 06/06/2019 CLINICAL DATA:  Severe headache.  History of aneurysm coiling. EXAM: CT ANGIOGRAPHY HEAD AND NECK TECHNIQUE: Multidetector CT imaging of the head and neck was performed using the standard protocol during bolus administration of intravenous contrast. Multiplanar CT image reconstructions and MIPs were obtained to evaluate the vascular anatomy. Carotid stenosis measurements (when applicable) are obtained utilizing NASCET criteria, using the distal internal carotid diameter as the denominator. CONTRAST:  32mL OMNIPAQUE IOHEXOL 350 MG/ML SOLN COMPARISON:  CT head 06/06/2019.  CTA head 11/12/2018 FINDINGS: CTA NECK FINDINGS Aortic arch: Standard branching. Imaged portion shows no evidence of aneurysm or dissection. No significant stenosis of the major arch vessel origins. Right carotid system: Carotid bifurcation widely patent. Negative for atherosclerotic disease or dissection Left carotid system: Carotid bifurcation widely patent. Negative for atherosclerotic disease or dissection. Vertebral arteries: Both vertebral arteries are normal and widely patent. Skeleton: Negative Other neck: Negative for mass or adenopathy. Upper chest: Multiple tiny calcified nodules right upper lobe compatible with granulomatous disease. No acute abnormality. Review of the MIP images confirms the above findings CTA HEAD FINDINGS Anterior circulation: Right cavernous carotid widely patent. Right anterior and middle cerebral arteries widely patent. Left cavernous carotid widely patent. 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 No recurrent aneurysm. Left anterior  and middle cerebral arteries widely patent.  Negative for aneurysm Posterior circulation: Both vertebral arteries widely patent. PICA patent bilaterally. AICA, superior cerebellar, posterior cerebral arteries widely patent bilaterally. Negative for stenosis or aneurysm. Venous sinuses: Normal venous enhancement. Right transverse sinus dominant. Anatomic variants: None Review of the MIP images confirms the above findings IMPRESSION: 1. 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 No recurrent aneurysm. No acute hemorrhage on CT earlier today 2. No significant intracranial or extracranial stenosis 3. Negative for cerebral aneurysm. Electronically Signed   By: Marlan Palau M.D.   On: 06/06/2019 14:53      IMPRESSION AND PLAN:   Dizziness/headache- may be due to migraine vs TIA. Seems like patient is having a combination of presyncope and vertigo. She has a history of cerebral aneurysm s/p coiling. -CT head was negative -CTA head/neck with aneurysm coiling but not new aneurysm or intracranial stenosis -Neurology consulted -MRI brain ordered -Check lipid panel and A1c -PT/OT/SLP -Start meclizine to see if this helps -Check orthostatic vitals -Will give IVFs overnight  Type 2 diabetes- recent A1c was 6.6% -Sensitive SSI  Hyperlipidemia- stable -Continue home zocor -Check lipid panel  Hypothyroidism- stable -Continue home synthroid  Fibromyalgia -Continue home voltaren, tramadol, cymbalta, gabapentin  All the records are reviewed and case discussed with ED provider. Management plans discussed with the patient, family and they are in agreement.  CODE STATUS: full  TOTAL TIME TAKING CARE OF THIS PATIENT: 45 minutes.    Jinny Blossom Cervando Durnin M.D on 06/06/2019 at 3:57 PM  Between 7am to 6pm - Pager - [phone]  After 6pm go to [email or web address] - Social research officer, government  [facility]  Office  [phone]  CC: Primary care physician; Leotis Shames, MD   Note: This dictation was prepared with Dragon  dictation along with smaller phrase technology. Any transcriptional errors that result from this process are unintentional.

## 2019-06-06 NOTE — ED Notes (Signed)
Called MRI, per MRI they are waiting on out of state MD for information regarding previous aneurism coiling.

## 2019-06-06 NOTE — ED Notes (Signed)
Pt states it is hard to focus when her eyes are open

## 2019-06-07 ENCOUNTER — Observation Stay: Payer: 59

## 2019-06-07 ENCOUNTER — Observation Stay
Admit: 2019-06-07 | Discharge: 2019-06-07 | Disposition: A | Discharge status: Home / Self Care | Payer: 59 | Attending: Internal Medicine | Admitting: Internal Medicine

## 2019-06-07 DIAGNOSIS — R42 Dizziness and giddiness: Secondary | ICD-10-CM | POA: Diagnosis not present

## 2019-06-07 LAB — ECHOCARDIOGRAM COMPLETE
Height: 66 in
Weight: 3440 oz

## 2019-06-07 LAB — GLUCOSE, CAPILLARY: Glucose-Capillary: 98 mg/dL (ref 70–99)

## 2019-06-07 LAB — LIPID PANEL
Cholesterol: 189 mg/dL (ref 0–200)
HDL: 38 mg/dL — ABNORMAL LOW (ref >40)
LDL Cholesterol: 120 mg/dL — ABNORMAL HIGH (ref 0–99)
Total CHOL/HDL Ratio: 5 RATIO
Triglycerides: 155 mg/dL — ABNORMAL HIGH (ref <150)
VLDL: 31 mg/dL (ref 0–40)

## 2019-06-07 LAB — SARS CORONAVIRUS 2 (TAT 6-24 HRS): SARS Coronavirus 2: NEGATIVE

## 2019-06-07 LAB — TSH: TSH: 1.418 u[IU]/mL (ref 0.350–4.500)

## 2019-06-07 MED ORDER — ASPIRIN 81 MG PO TBEC: 81.0000 mg | DELAYED_RELEASE_TABLET | Freq: Every day | ORAL | 0 refills | Status: DC

## 2019-06-07 MED ORDER — DICLOFENAC SODIUM 75 MG PO TBEC: 75.0000 mg | DELAYED_RELEASE_TABLET | Freq: Every day | ORAL | Status: DC

## 2019-06-07 MED ORDER — MECLIZINE HCL 12.5 MG PO TABS: 25.0000 mg | ORAL_TABLET | Freq: Three times a day (TID) | ORAL | 0 refills | Status: AC | PRN

## 2019-06-07 NOTE — Progress Notes (Signed)
OT Screen Note  Patient Details Name: Michaela Caldwell MRN: [id number] DOB: [birth year]   OT Screen:    Reason Eval/Treat Not Completed: OT screened, no needs identified, will sign off. Order received and chart reviewed. Upon arrival to pt room, pt semi-supine in bed, dressed in street clothes, stating symptoms have generally resolved and that she feels back to baseline level of independence. Pt reports that despite some L arm weakness and a headache, she feels she could manage her daily routines without assistance. Pt ambulates in room independently and has used room bathroom without assistance. No skilled OT needs identified. Will sign off at this time. Please re-consult if additional OT needs arise during this admission.   Shara Blazing, M.S., OTR/L Ascom: [phone] 06/07/19, 9:15 AM

## 2019-06-07 NOTE — Plan of Care (Signed)
  Problem: Education: Goal: Knowledge of disease or condition will improve Outcome: Progressing Goal: Knowledge of secondary prevention will improve Outcome: Progressing Goal: Knowledge of patient specific risk factors addressed and post discharge goals established will improve Outcome: Progressing   Problem: Coping: Goal: Will verbalize positive feelings about self Outcome: Progressing Goal: Will identify appropriate support needs Outcome: Progressing   Problem: Health Behavior/Discharge Planning: Goal: Ability to manage health-related needs will improve Outcome: Progressing   Problem: Self-Care: Goal: Ability to participate in self-care as condition permits will improve Outcome: Progressing Goal: Verbalization of feelings and concerns over difficulty with self-care will improve Outcome: Progressing Goal: Ability to communicate needs accurately will improve Outcome: Progressing   Problem: Nutrition: Goal: Risk of aspiration will decrease Outcome: Progressing Goal: Dietary intake will improve Outcome: Progressing   Problem: Education: Goal: Knowledge of General Education information will improve Description: Including pain rating scale, medication(s)/side effects and non-pharmacologic comfort measures Outcome: Progressing   Problem: Health Behavior/Discharge Planning: Goal: Ability to manage health-related needs will improve Outcome: Progressing   Problem: Clinical Measurements: Goal: Ability to maintain clinical measurements within normal limits will improve Outcome: Progressing Goal: Will remain free from infection Outcome: Progressing Goal: Diagnostic test results will improve Outcome: Progressing Goal: Respiratory complications will improve Outcome: Progressing Goal: Cardiovascular complication will be avoided Outcome: Progressing   Problem: Activity: Goal: Risk for activity intolerance will decrease Outcome: Progressing   Problem: Nutrition: Goal:  Adequate nutrition will be maintained Outcome: Progressing   Problem: Coping: Goal: Level of anxiety will decrease Outcome: Progressing   Problem: Elimination: Goal: Will not experience complications related to bowel motility Outcome: Progressing Goal: Will not experience complications related to urinary retention Outcome: Progressing   Problem: Pain Managment: Goal: General experience of comfort will improve Outcome: Progressing   Problem: Safety: Goal: Ability to remain free from injury will improve Outcome: Progressing   Problem: Skin Integrity: Goal: Risk for impaired skin integrity will decrease Outcome: Progressing

## 2019-06-07 NOTE — Discharge Summary (Signed)
[facility] [place] NAME: Michaela Caldwell    MR#:  [id number]  DATE OF BIRTH:  1970/10/08  DATE OF ADMISSION:  06/06/2019 ADMITTING PHYSICIAN: Sela Hua, MD  DATE OF DISCHARGE: 06/07/2019 12:58 PM  PRIMARY CARE PHYSICIAN: Dr. Frazier Richards   ADMISSION DIAGNOSIS:  Cerebrovascular accident (CVA), unspecified mechanism ([place]) [I63.9]  DISCHARGE DIAGNOSIS:  Active Problems:   Dizziness   SECONDARY DIAGNOSIS:   Past Medical History:  Diagnosis Date  . Anxiety   . Diabetes mellitus without complication ([place])   . Elevated lipids   . Fibromyalgia   . GERD (gastroesophageal reflux disease)   . Headache   . Hypothyroidism   . Shoulder pain, right    elbow pain    HOSPITAL COURSE:   1.  Headache and dizziness.  MRI of the brain negative.  CT Angio of the neck negative.  Echocardiogram done but pending.  Prescribed aspirin.  PRN meclizine.  Neurology ordered a CT scan of the cervical spine but results pending upon discharge.  Patient not orthostatic.  Tympanic membrane bilaterally normal.  Patient felt better and wanted to go home.  Since I do not think this is a stroke, I did not take a step up in cholesterol medication because the patient also did not tolerate Lipitor in the past. 2.  Type 2 diabetes mellitus.  Diet controlled.  Sugars here well controlled.  Admitting physician documented hemoglobin A1c is 6.6. 3.  Hyperlipidemia on Zocor 4.  Hypothyroidism unspecified on Synthroid 5.  Fibromyalgia on numerous medications  DISCHARGE CONDITIONS:   Satisfactory  CONSULTS OBTAINED:  Treatment Team:  Catarina Hartshorn, MD  DRUG ALLERGIES:   Allergies  Allergen Reactions  . Augmentin [Amoxicillin-Pot Clavulanate]     Diarrhea     DISCHARGE MEDICATIONS:   Allergies as of 06/07/2019      Reactions   Augmentin [amoxicillin-pot Clavulanate]    Diarrhea      Medication List    TAKE these medications   aspirin 81 MG EC  tablet Take 1 tablet (81 mg total) by mouth daily.   diclofenac 75 MG EC tablet Commonly known as: VOLTAREN Take 1 tablet (75 mg total) by mouth at bedtime. What changed: when to take this   DULoxetine 60 MG capsule Commonly known as: CYMBALTA Take 60 mg by mouth daily.   gabapentin 100 MG capsule Commonly known as: NEURONTIN Take 100 mg by mouth See admin instructions. Take 1 capsule (100mg ) by mouth twice to three times daily   levothyroxine 25 MCG tablet Commonly known as: SYNTHROID Take 25 mcg by mouth daily before breakfast.   meclizine 12.5 MG tablet Commonly known as: ANTIVERT Take 2 tablets (25 mg total) by mouth 3 (three) times daily as needed for dizziness.   simvastatin 40 MG tablet Commonly known as: ZOCOR Take 40 mg by mouth daily.   traMADol 50 MG tablet Commonly known as: ULTRAM Take 50 mg by mouth 2 (two) times daily as needed for pain.   traZODone 50 MG tablet Commonly known as: DESYREL Take 50 mg by mouth at bedtime.   Vitamin D (Ergocalciferol) 1.25 MG (50000 UT) Caps capsule Commonly known as: DRISDOL Take 50,000 Units by mouth every 7 (seven) days.        DISCHARGE INSTRUCTIONS:   Follow-up PMD 5 days.  Will need follow-up on echocardiogram and CT scan of the cervical spine.  If you experience worsening of your admission symptoms, develop shortness of breath, life  threatening emergency, suicidal or homicidal thoughts you must seek medical attention immediately by calling 911 or calling your MD immediately  if symptoms less severe.  You Must read complete instructions/literature along with all the possible adverse reactions/side effects for all the Medicines you take and that have been prescribed to you. Take any new Medicines after you have completely understood and accept all the possible adverse reactions/side effects.   Please note  You were cared for by a hospitalist during your hospital stay. If you have any questions about your  discharge medications or the care you received while you were in the hospital after you are discharged, you can call the unit and asked to speak with the hospitalist on call if the hospitalist that took care of you is not available. Once you are discharged, your primary care physician will handle any further medical issues. Please note that NO REFILLS for any discharge medications will be authorized once you are discharged, as it is imperative that you return to your primary care physician (or establish a relationship with a primary care physician if you do not have one) for your aftercare needs so that they can reassess your need for medications and monitor your lab values.    Today   CHIEF COMPLAINT:   Chief Complaint  Patient presents with  . Dizziness    HISTORY OF PRESENT ILLNESS:  Michaela Caldwell  is a 48 y.o. female came in with dizziness and headache   VITAL SIGNS:  Blood pressure 132/76, pulse 78, temperature 98.3 F (36.8 C), resp. rate 19, height 5\' 6"  (1.676 m), weight 97.5 kg, last menstrual period 10/15/2013, SpO2 95 %.   PHYSICAL EXAMINATION:  GENERAL:  48 y.o.-year-old patient lying in the bed with no acute distress.  EYES: Pupils equal, round, reactive to light and accommodation. No scleral icterus. Extraocular muscles intact.  HEENT: Head atraumatic, normocephalic. Oropharynx and nasopharynx clear.  NECK:  Supple, no jugular venous distention. No thyroid enlargement, no tenderness.  LUNGS: Normal breath sounds bilaterally, no wheezing, rales,rhonchi or crepitation. No use of accessory muscles of respiration.  CARDIOVASCULAR: S1, S2 normal. No murmurs, rubs, or gallops.  ABDOMEN: Soft, non-tender, non-distended. Bowel sounds present. No organomegaly or mass.  EXTREMITIES: No pedal edema, cyanosis, or clubbing.  NEUROLOGIC: Cranial nerves II through XII are intact. Muscle strength 5/5 in all extremities. Sensation intact. Gait not checked.  PSYCHIATRIC: The patient is  alert and oriented x 3.  SKIN: No obvious rash, lesion, or ulcer.   DATA REVIEW:   CBC Recent Labs  Lab 06/06/19 1233  WBC 9.7  HGB 15.6*  HCT 47.5*  PLT 279    Chemistries  Recent Labs  Lab 06/06/19 1233  NA 140  K 4.1  CL 104  CO2 23  GLUCOSE 95  BUN 12  CREATININE 0.65  CALCIUM 9.4  AST 18  ALT 47*  ALKPHOS 91  BILITOT 0.8    Microbiology Results  Results for orders placed or performed during the hospital encounter of 06/06/19  SARS CORONAVIRUS 2 (TAT 6-24 HRS) Nasopharyngeal Nasopharyngeal Swab     Status: None   Collection Time: 06/06/19  3:35 PM   Specimen: Nasopharyngeal Swab  Result Value Ref Range Status   SARS Coronavirus 2 NEGATIVE NEGATIVE Final    Comment: (NOTE) SARS-CoV-2 target nucleic acids are NOT DETECTED. The SARS-CoV-2 RNA is generally detectable in upper and lower respiratory specimens during the acute phase of infection. Negative results do not preclude SARS-CoV-2 infection, do not rule  out co-infections with other pathogens, and should not be used as the sole basis for treatment or other patient management decisions. Negative results must be combined with clinical observations, patient history, and epidemiological information. The expected result is Negative. Fact Sheet for Patients: HairSlick.no Fact Sheet for Healthcare Providers: quierodirigir.com This test is not yet approved or cleared by the [place] FDA and  has been authorized for detection and/or diagnosis of SARS-CoV-2 by FDA under an Emergency Use Authorization (EUA). This EUA will remain  in effect (meaning this test can be used) for the duration of the COVID-19 declaration under Section 56 4(b)(1) of the Act, 21 U.S.C. section 360bbb-3(b)(1), unless the authorization is terminated or revoked sooner. Performed at [facility], [address], [place], [place] [address]     RADIOLOGY:  Ct Angio Head W  Or Wo Contrast  Result Date: 06/06/2019 CLINICAL DATA:  Severe headache.  History of aneurysm coiling. EXAM: CT ANGIOGRAPHY HEAD AND NECK TECHNIQUE: Multidetector CT imaging of the head and neck was performed using the standard protocol during bolus administration of intravenous contrast. Multiplanar CT image reconstructions and MIPs were obtained to evaluate the vascular anatomy. Carotid stenosis measurements (when applicable) are obtained utilizing NASCET criteria, using the distal internal carotid diameter as the denominator. CONTRAST:  75mL OMNIPAQUE IOHEXOL 350 MG/ML SOLN COMPARISON:  CT head 06/06/2019.  CTA head 11/12/2018 FINDINGS: CTA NECK FINDINGS Aortic arch: Standard branching. Imaged portion shows no evidence of aneurysm or dissection. No significant stenosis of the major arch vessel origins. Right carotid system: Carotid bifurcation widely patent. Negative for atherosclerotic disease or dissection Left carotid system: Carotid bifurcation widely patent. Negative for atherosclerotic disease or dissection. Vertebral arteries: Both vertebral arteries are normal and widely patent. Skeleton: Negative Other neck: Negative for mass or adenopathy. Upper chest: Multiple tiny calcified nodules right upper lobe compatible with granulomatous disease. No acute abnormality. Review of the MIP images confirms the above findings CTA HEAD FINDINGS Anterior circulation: Right cavernous carotid widely patent. Right anterior and middle cerebral arteries widely patent. Left cavernous carotid widely patent. 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 No recurrent aneurysm. Left anterior and middle cerebral arteries widely patent. Negative for aneurysm Posterior circulation: Both vertebral arteries widely patent. PICA patent bilaterally. AICA, superior cerebellar, posterior cerebral arteries widely patent bilaterally. Negative for stenosis or aneurysm. Venous sinuses: Normal venous enhancement. Right  transverse sinus dominant. Anatomic variants: None Review of the MIP images confirms the above findings IMPRESSION: 1. 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 No recurrent aneurysm. No acute hemorrhage on CT earlier today 2. No significant intracranial or extracranial stenosis 3. Negative for cerebral aneurysm. Electronically Signed   By: Marlan Palau M.D.   On: 06/06/2019 14:53   Ct Head Wo Contrast  Result Date: 06/06/2019 CLINICAL DATA:  Focal neuro deficit, greater than 6 hours, stroke suspected. Additional history: Dizziness that started yesterday afternoon. EXAM: CT HEAD WITHOUT CONTRAST TECHNIQUE: Contiguous axial images were obtained from the base of the skull through the vertex without intravenous contrast. COMPARISON:  CT angiogram head 11/12/2018 FINDINGS: Brain: No evidence of acute intracranial hemorrhage. No demarcated cortical infarction. No evidence of intracranial mass. No midline shift or extra-axial fluid collection. Cerebral volume is normal for age. Partially empty sella turcica. Vascular: Streak artifact arising from redemonstrated left ICA coil embolization pack. No hyperdense vessel is seen. Skull: Normal. Negative for fracture or focal lesion. Sinuses/Orbits: Visualized orbits demonstrate no acute abnormality. Visualized paranasal sinuses are clear. Visualized mastoid  air cells are clear. IMPRESSION: No CT evidence of acute intracranial abnormality. Redemonstrated left ICA coil embolization material with associated streak artifact. Electronically Signed   By: Jackey Loge   On: 06/06/2019 12:59   Ct Angio Neck W And/or Wo Contrast  Result Date: 06/06/2019 CLINICAL DATA:  Severe headache.  History of aneurysm coiling. EXAM: CT ANGIOGRAPHY HEAD AND NECK TECHNIQUE: Multidetector CT imaging of the head and neck was performed using the standard protocol during bolus administration of intravenous contrast. Multiplanar CT image reconstructions and MIPs were obtained to  evaluate the vascular anatomy. Carotid stenosis measurements (when applicable) are obtained utilizing NASCET criteria, using the distal internal carotid diameter as the denominator. CONTRAST:  75mL OMNIPAQUE IOHEXOL 350 MG/ML SOLN COMPARISON:  CT head 06/06/2019.  CTA head 11/12/2018 FINDINGS: CTA NECK FINDINGS Aortic arch: Standard branching. Imaged portion shows no evidence of aneurysm or dissection. No significant stenosis of the major arch vessel origins. Right carotid system: Carotid bifurcation widely patent. Negative for atherosclerotic disease or dissection Left carotid system: Carotid bifurcation widely patent. Negative for atherosclerotic disease or dissection. Vertebral arteries: Both vertebral arteries are normal and widely patent. Skeleton: Negative Other neck: Negative for mass or adenopathy. Upper chest: Multiple tiny calcified nodules right upper lobe compatible with granulomatous disease. No acute abnormality. Review of the MIP images confirms the above findings CTA HEAD FINDINGS Anterior circulation: Right cavernous carotid widely patent. Right anterior and middle cerebral arteries widely patent. Left cavernous carotid widely patent. 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 No recurrent aneurysm. Left anterior and middle cerebral arteries widely patent. Negative for aneurysm Posterior circulation: Both vertebral arteries widely patent. PICA patent bilaterally. AICA, superior cerebellar, posterior cerebral arteries widely patent bilaterally. Negative for stenosis or aneurysm. Venous sinuses: Normal venous enhancement. Right transverse sinus dominant. Anatomic variants: None Review of the MIP images confirms the above findings IMPRESSION: 1. 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 No recurrent aneurysm. No acute hemorrhage on CT earlier today 2. No significant intracranial or extracranial stenosis 3. Negative for cerebral aneurysm. Electronically Signed   By:  Marlan Palau M.D.   On: 06/06/2019 14:53   Mr Brain Wo Contrast  Result Date: 06/06/2019 CLINICAL DATA:  Severe headache. History of aneurysm coiling. Left Peri ophthalmic. Dizziness. Negative CT evaluation for acute finding. EXAM: MRI HEAD WITHOUT CONTRAST TECHNIQUE: Multiplanar, multiecho pulse sequences of the brain and surrounding structures were obtained without intravenous contrast. COMPARISON:  CT study same day. FINDINGS: Brain: Diffusion imaging does not show any acute or subacute infarction. Brainstem and cerebellum appear normal. Cerebral hemispheres are similarly normal without old or acute small or large vessel infarction, mass lesion, hemorrhage, hydrocephalus or extra-axial collection. Vascular: Major vessels at the base of the brain show flow. Skull and upper cervical spine: Negative Sinuses/Orbits: Clear/normal Other: No fluid in the middle ears or mastoids. IMPRESSION: Normal examination. No abnormality seen to explain the clinical presentation. Electronically Signed   By: Paulina Fusi M.D.   On: 06/06/2019 22:52      Management plans discussed with the patient, and she is in agreement.  CODE STATUS:     Code Status Orders  (From admission, onward)         Start     Ordered   06/06/19 1729  Full code  Continuous     06/06/19 1728        Code Status History    Date Active Date Inactive Code Status Order ID Comments User Context   10/21/2018  1356 10/22/2018 0105 Full Code [id number]  Schermerhorn, Ihor Austin, MD Inpatient   10/21/2018 0818 10/21/2018 1356 Full Code [id number]  Schermerhorn, Ihor Austin, MD Inpatient   Advance Care Planning Activity      TOTAL TIME TAKING CARE OF THIS PATIENT: 35 minutes.    Alford Highland M.D on 06/07/2019 at 1:19 PM  Between 7am to 6pm - Pager - [phone]  After 6pm go to [email or web address] - password [email or web address]  [facility]  [phone]  CC: Primary care physician; Dr. Einar Crow

## 2019-06-07 NOTE — Progress Notes (Signed)
Pt ready for discharge, but Dr. Doy Mince requested CT scan before she leaves.  CT done.  Discharge instructions reviewed with patient, all questions answered.  Personal belongings gathered and sent with pt.  Pt discharged to home with husband.

## 2019-06-07 NOTE — Evaluation (Signed)
Physical Therapy Evaluation Patient Details Name: PAILYNN Caldwell MRN: [id number] DOB: [birth year] Today's Date: 06/07/2019   History of Present Illness  From MD H&P: Pt is a 48 y.o. female with a known history of type 2 diabetes, fibromyalgia, headache, hypothyroidism, anxiety, hx cerebral aneurysm s/p coiling who presented to the ED with dizziness and headaches. She states that she had a bad headache yesterday that felt like "pressure behind her eyes". She also noticed blurred vision in her left eye and had difficulty reading the street signs while she was driving yesterday. This morning, she was dizzy and lightheaded. She felt like she was going to pass out, but she also felt like the room was spinning. She states that she "felt heavy" and "like she was going to fall forwards".  In the ED, vitals and labs were unremarkable.  CT head was unremarkable.  CTA head/neck with aneurysm coiling in the left peri-ophthalmic artery region.  ED physician discussed with neurology (Dr. Thad Ranger) who recommended admission for possible TIA.  Hospitalists were called for admission.    Clinical Impression  Pt presented without deficits in strength, transfers, mobility, gait, balance, or activity tolerance.  Pt Ind with all functional mobility tasks and amb without an AD with no instability or ataxia noted.  BUE and BLE strength, coordination, sensation all WNL.  Pt able to stand with SLS on each LE > 10 sec with very good stability.  Peripheral vision and VOR all WNL. Pt was noted to have a + Romberg sign, neurologist in the room during test and aware.  Pt reports feeling at her baseline at this time with no PT needs identified.  Will complete orders but will reassess pt pending a change in status upon receipt of new PT orders.         Follow Up Recommendations No PT follow up    Equipment Recommendations  None recommended by PT    Recommendations for Other Services       Precautions / Restrictions  Precautions Precautions: Fall Restrictions Weight Bearing Restrictions: No      Mobility  Bed Mobility Overal bed mobility: Independent                Transfers Overall transfer level: Independent                  Ambulation/Gait Ambulation/Gait assistance: Independent   Assistive device: None       General Gait Details: Pt steady with ambulation including sharp turns without use of an AD; pt reports feeling amb is at baseline  Careers information officer    Modified Rankin (Stroke Patients Only)       Balance Overall balance assessment: Independent(SLS time > 10 sec on both RLE and LLE; + Romberg sign but no physial assistance required to prevent LOB)                                           Pertinent Vitals/Pain Pain Assessment: No/denies pain    Home Living Family/patient expects to be discharged to:: Private residence Living Arrangements: Spouse/significant other;Children Available Help at Discharge: Family;Available 24 hours/day Type of Home: House Home Access: Stairs to enter Entrance Stairs-Rails: Right Entrance Stairs-Number of Steps: 3 Home Layout: One level Home Equipment: Crutches      Prior Function Level of Independence: Independent  Comments: Ind with Amb without an AD, no concerning fall history, Ind with ADLs     Hand Dominance   Dominant Hand: Right    Extremity/Trunk Assessment   Upper Extremity Assessment Upper Extremity Assessment: Overall WFL for tasks assessed;RUE deficits/detail;LUE deficits/detail RUE Deficits / Details: Strength, RAM, finger to nose, sequential thumb to fingers opposition all WNL RUE Sensation: WNL RUE Coordination: WNL LUE Deficits / Details: Strength, RAM, finger to nose, sequential thumb to fingers opposition all WNL LUE Sensation: WNL LUE Coordination: WNL    Lower Extremity Assessment Lower Extremity Assessment: Overall WFL for tasks  assessed;RLE deficits/detail;LLE deficits/detail RLE Deficits / Details: Strength and heel to shin WNL RLE Sensation: WNL RLE Coordination: WNL LLE Deficits / Details: Strength and heel to shin WNL LLE Sensation: WNL LLE Coordination: WNL       Communication   Communication: No difficulties  Cognition Arousal/Alertness: Awake/alert Behavior During Therapy: WFL for tasks assessed/performed Overall Cognitive Status: Within Functional Limits for tasks assessed                                        General Comments      Exercises     Assessment/Plan    PT Assessment Patent does not need any further PT services  PT Problem List         PT Treatment Interventions      PT Goals (Current goals can be found in the Care Plan section)  Acute Rehab PT Goals PT Goal Formulation: All assessment and education complete, DC therapy    Frequency     Barriers to discharge        Co-evaluation               AM-PAC PT "6 Clicks" Mobility  Outcome Measure Help needed turning from your back to your side while in a flat bed without using bedrails?: None Help needed moving from lying on your back to sitting on the side of a flat bed without using bedrails?: None Help needed moving to and from a bed to a chair (including a wheelchair)?: None Help needed standing up from a chair using your arms (e.g., wheelchair or bedside chair)?: None Help needed to walk in hospital room?: None Help needed climbing 3-5 steps with a railing? : None 6 Click Score: 24    End of Session Equipment Utilized During Treatment: Gait belt Activity Tolerance: Patient tolerated treatment well Patient left: in bed;with call bell/phone within reach;with nursing/sitter in room Nurse Communication: Mobility status PT Visit Diagnosis: Muscle weakness (generalized) (M62.81)    Time: 2992-4268 PT Time Calculation (min) (ACUTE ONLY): 16 min   Charges:   PT Evaluation $PT Eval Low  Complexity: 1 Low          D. Scott Tullio Chausse PT, DPT 06/07/19, 10:04 AM

## 2019-06-07 NOTE — Evaluation (Signed)
SLP Cancellation Note  Patient Details Name: Michaela Caldwell MRN: [id number] DOB: 1971/02/17   Cancelled treatment:       Reason Eval/Treat Not Completed: SLP screened, no needs identified, will sign off NSG reports pt is wfl. Pt states no changes with speech or swallow. ST notified to contact [place] or ST if changes occur.    [place] Sauber 06/07/2019, 9:06 AM

## 2019-06-07 NOTE — Progress Notes (Signed)
Subjective: Patient continues to experience some vertigo but headache improved and rated at a 2/10.  Now reports shooting pain from her neck throughout her head.    Objective: Current vital signs: BP 132/76 (BP Location: Right Arm)   Pulse 78   Temp 98.3 F (36.8 C)   Resp 19   Ht 5\' 6"  (1.676 m)   Wt 97.5 kg   LMP 10/15/2013   SpO2 95%   BMI 34.70 kg/m  Vital signs in last 24 hours: Temp:  [98 F (36.7 C)-98.3 F (36.8 C)] 98.3 F (36.8 C) (09/26 0243) Pulse Rate:  [57-78] 78 (09/26 0243) Resp:  [12-19] 19 (09/26 0243) BP: (112-152)/(76-89) 132/76 (09/26 0243) SpO2:  [95 %-98 %] 95 % (09/26 0243) FiO2 (%):  [21 %] 21 % (09/25 1729) Weight:  [97.5 kg] 97.5 kg (09/25 1216)  Intake/Output from previous day: 09/25 0701 - 09/26 0700 In: 500.1 [I.V.:500.1] Out: -  Intake/Output this shift: Total I/O In: 240 [P.O.:240] Out: -  Nutritional status:  Diet Order            Diet Heart Room service appropriate? Yes; Fluid consistency: Thin  Diet effective now              Neurologic Exam: Mental Status: Alert, oriented, thought content appropriate.  Speech fluent without evidence of aphasia.  Able to follow 3 step commands without difficulty. Cranial Nerves: II: Discs flat bilaterally; Visual fields grossly normal, pupils equal, round, reactive to light and accommodation III,IV, VI: ptosis not present, extra-ocular motions intact bilaterally V,VII: smile symmetric, facial light touch sensation normal bilaterally VIII: hearing normal bilaterally IX,X: gag reflex present XI: bilateral shoulder shrug XII: midline tongue extension Motor: 5/5 throughout Sensory: Pinprick and light touch intact throughout, bilaterally  Lab Results: Basic Metabolic Panel: Recent Labs  Lab 06/06/19 1233  NA 140  K 4.1  CL 104  CO2 23  GLUCOSE 95  BUN 12  CREATININE 0.65  CALCIUM 9.4    Liver Function Tests: Recent Labs  Lab 06/06/19 1233  AST 18  ALT 47*  ALKPHOS 91   BILITOT 0.8  PROT 7.3  ALBUMIN 4.6   No results for input(s): LIPASE, AMYLASE in the last 168 hours. No results for input(s): AMMONIA in the last 168 hours.  CBC: Recent Labs  Lab 06/06/19 1233  WBC 9.7  NEUTROABS 6.5  HGB 15.6*  HCT 47.5*  MCV 86.7  PLT 279    Cardiac Enzymes: No results for input(s): CKTOTAL, CKMB, CKMBINDEX, TROPONINI in the last 168 hours.  Lipid Panel: Recent Labs  Lab 06/07/19 0514  CHOL 189  TRIG 155*  HDL 38*  CHOLHDL 5.0  VLDL 31  LDLCALC 120*    CBG: Recent Labs  Lab 06/06/19 1226 06/07/19 0743  GLUCAP 102* 98    Microbiology: Results for orders placed or performed during the hospital encounter of 06/06/19  SARS CORONAVIRUS 2 (TAT 6-24 HRS) Nasopharyngeal Nasopharyngeal Swab     Status: None   Collection Time: 06/06/19  3:35 PM   Specimen: Nasopharyngeal Swab  Result Value Ref Range Status   SARS Coronavirus 2 NEGATIVE NEGATIVE Final    Comment: (NOTE) SARS-CoV-2 target nucleic acids are NOT DETECTED. The SARS-CoV-2 RNA is generally detectable in upper and lower respiratory specimens during the acute phase of infection. Negative results do not preclude SARS-CoV-2 infection, do not rule out co-infections with other pathogens, and should not be used as the sole basis for treatment or other patient management decisions. Negative  results must be combined with clinical observations, patient history, and epidemiological information. The expected result is Negative. Fact Sheet for Patients: HairSlick.no Fact Sheet for Healthcare Providers: quierodirigir.com This test is not yet approved or cleared by the [place] FDA and  has been authorized for detection and/or diagnosis of SARS-CoV-2 by FDA under an Emergency Use Authorization (EUA). This EUA will remain  in effect (meaning this test can be used) for the duration of the COVID-19 declaration under Section 56 4(b)(1) of  the Act, 21 U.S.C. section 360bbb-3(b)(1), unless the authorization is terminated or revoked sooner. Performed at [facility], [address], [place], [place] [address]     Coagulation Studies: Recent Labs    06/06/19 1233  LABPROT 12.6  INR 1.0    Imaging: Ct Angio Head W Or Wo Contrast  Result Date: 06/06/2019 CLINICAL DATA:  Severe headache.  History of aneurysm coiling. EXAM: CT ANGIOGRAPHY HEAD AND NECK TECHNIQUE: Multidetector CT imaging of the head and neck was performed using the standard protocol during bolus administration of intravenous contrast. Multiplanar CT image reconstructions and MIPs were obtained to evaluate the vascular anatomy. Carotid stenosis measurements (when applicable) are obtained utilizing NASCET criteria, using the distal internal carotid diameter as the denominator. CONTRAST:  75mL OMNIPAQUE IOHEXOL 350 MG/ML SOLN COMPARISON:  CT head 06/06/2019.  CTA head 11/12/2018 FINDINGS: CTA NECK FINDINGS Aortic arch: Standard branching. Imaged portion shows no evidence of aneurysm or dissection. No significant stenosis of the major arch vessel origins. Right carotid system: Carotid bifurcation widely patent. Negative for atherosclerotic disease or dissection Left carotid system: Carotid bifurcation widely patent. Negative for atherosclerotic disease or dissection. Vertebral arteries: Both vertebral arteries are normal and widely patent. Skeleton: Negative Other neck: Negative for mass or adenopathy. Upper chest: Multiple tiny calcified nodules right upper lobe compatible with granulomatous disease. No acute abnormality. Review of the MIP images confirms the above findings CTA HEAD FINDINGS Anterior circulation: Right cavernous carotid widely patent. Right anterior and middle cerebral arteries widely patent. Left cavernous carotid widely patent. 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 No recurrent aneurysm. Left anterior and middle cerebral  arteries widely patent. Negative for aneurysm Posterior circulation: Both vertebral arteries widely patent. PICA patent bilaterally. AICA, superior cerebellar, posterior cerebral arteries widely patent bilaterally. Negative for stenosis or aneurysm. Venous sinuses: Normal venous enhancement. Right transverse sinus dominant. Anatomic variants: None Review of the MIP images confirms the above findings IMPRESSION: 1. 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 No recurrent aneurysm. No acute hemorrhage on CT earlier today 2. No significant intracranial or extracranial stenosis 3. Negative for cerebral aneurysm. Electronically Signed   By: Marlan Palau M.D.   On: 06/06/2019 14:53   Ct Head Wo Contrast  Result Date: 06/06/2019 CLINICAL DATA:  Focal neuro deficit, greater than 6 hours, stroke suspected. Additional history: Dizziness that started yesterday afternoon. EXAM: CT HEAD WITHOUT CONTRAST TECHNIQUE: Contiguous axial images were obtained from the base of the skull through the vertex without intravenous contrast. COMPARISON:  CT angiogram head 11/12/2018 FINDINGS: Brain: No evidence of acute intracranial hemorrhage. No demarcated cortical infarction. No evidence of intracranial mass. No midline shift or extra-axial fluid collection. Cerebral volume is normal for age. Partially empty sella turcica. Vascular: Streak artifact arising from redemonstrated left ICA coil embolization pack. No hyperdense vessel is seen. Skull: Normal. Negative for fracture or focal lesion. Sinuses/Orbits: Visualized orbits demonstrate no acute abnormality. Visualized paranasal sinuses are clear. Visualized mastoid air cells are clear. IMPRESSION:  No CT evidence of acute intracranial abnormality. Redemonstrated left ICA coil embolization material with associated streak artifact. Electronically Signed   By: Jackey Loge   On: 06/06/2019 12:59   Ct Angio Neck W And/or Wo Contrast  Result Date: 06/06/2019 CLINICAL DATA:   Severe headache.  History of aneurysm coiling. EXAM: CT ANGIOGRAPHY HEAD AND NECK TECHNIQUE: Multidetector CT imaging of the head and neck was performed using the standard protocol during bolus administration of intravenous contrast. Multiplanar CT image reconstructions and MIPs were obtained to evaluate the vascular anatomy. Carotid stenosis measurements (when applicable) are obtained utilizing NASCET criteria, using the distal internal carotid diameter as the denominator. CONTRAST:  35mL OMNIPAQUE IOHEXOL 350 MG/ML SOLN COMPARISON:  CT head 06/06/2019.  CTA head 11/12/2018 FINDINGS: CTA NECK FINDINGS Aortic arch: Standard branching. Imaged portion shows no evidence of aneurysm or dissection. No significant stenosis of the major arch vessel origins. Right carotid system: Carotid bifurcation widely patent. Negative for atherosclerotic disease or dissection Left carotid system: Carotid bifurcation widely patent. Negative for atherosclerotic disease or dissection. Vertebral arteries: Both vertebral arteries are normal and widely patent. Skeleton: Negative Other neck: Negative for mass or adenopathy. Upper chest: Multiple tiny calcified nodules right upper lobe compatible with granulomatous disease. No acute abnormality. Review of the MIP images confirms the above findings CTA HEAD FINDINGS Anterior circulation: Right cavernous carotid widely patent. Right anterior and middle cerebral arteries widely patent. Left cavernous carotid widely patent. 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 No recurrent aneurysm. Left anterior and middle cerebral arteries widely patent. Negative for aneurysm Posterior circulation: Both vertebral arteries widely patent. PICA patent bilaterally. AICA, superior cerebellar, posterior cerebral arteries widely patent bilaterally. Negative for stenosis or aneurysm. Venous sinuses: Normal venous enhancement. Right transverse sinus dominant. Anatomic variants: None Review of  the MIP images confirms the above findings IMPRESSION: 1. 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 No recurrent aneurysm. No acute hemorrhage on CT earlier today 2. No significant intracranial or extracranial stenosis 3. Negative for cerebral aneurysm. Electronically Signed   By: Marlan Palau M.D.   On: 06/06/2019 14:53   Mr Brain Wo Contrast  Result Date: 06/06/2019 CLINICAL DATA:  Severe headache. History of aneurysm coiling. Left Peri ophthalmic. Dizziness. Negative CT evaluation for acute finding. EXAM: MRI HEAD WITHOUT CONTRAST TECHNIQUE: Multiplanar, multiecho pulse sequences of the brain and surrounding structures were obtained without intravenous contrast. COMPARISON:  CT study same day. FINDINGS: Brain: Diffusion imaging does not show any acute or subacute infarction. Brainstem and cerebellum appear normal. Cerebral hemispheres are similarly normal without old or acute small or large vessel infarction, mass lesion, hemorrhage, hydrocephalus or extra-axial collection. Vascular: Major vessels at the base of the brain show flow. Skull and upper cervical spine: Negative Sinuses/Orbits: Clear/normal Other: No fluid in the middle ears or mastoids. IMPRESSION: Normal examination. No abnormality seen to explain the clinical presentation. Electronically Signed   By: Paulina Fusi M.D.   On: 06/06/2019 22:52    Medications:  I have reviewed the patient's current medications. Scheduled: . aspirin EC  81 mg Oral Daily  . diclofenac  75 mg Oral BID  . DULoxetine  60 mg Oral QHS  . enoxaparin (LOVENOX) injection  40 mg Subcutaneous Q24H  . gabapentin  100 mg Oral TID  . insulin aspart  0-5 Units Subcutaneous QHS  . insulin aspart  0-9 Units Subcutaneous TID WC  . levothyroxine  25 mcg Oral QAC breakfast  . meclizine  25 mg Oral TID  .  simvastatin  40 mg Oral Daily  . traZODone  50 mg Oral QHS  . Vitamin D (Ergocalciferol)  50,000 Units Oral Q7 days    Assessment/Plan: 48 year old  female admitted with atypical headache.  CT, CTA and MRI of the brain reviewed and show no etiology for presentation.  Headache improved although patient continues to have some vertigo.    Recommendations: 1. TSH, B12 pending 2. CT of the cervical spine 3. If above imagine unremarkable patient may continue follow up on an outpatient basis.     LOS: 0 days   Thana FarrLeslie Arihanna Estabrook, MD Neurology [phone] 06/07/2019  11:45 AM

## 2019-06-08 LAB — HIV ANTIBODY (ROUTINE TESTING W REFLEX): HIV Screen 4th Generation wRfx: NONREACTIVE

## 2019-06-09 ENCOUNTER — Other Ambulatory Visit: Payer: Self-pay | Admitting: Internal Medicine

## 2019-06-09 DIAGNOSIS — Z1231 Encounter for screening mammogram for malignant neoplasm of breast: Secondary | ICD-10-CM

## 2019-06-09 LAB — HEMOGLOBIN A1C
Hgb A1c MFr Bld: 6.4 % — ABNORMAL HIGH (ref 4.8–5.6)
Mean Plasma Glucose: 137 mg/dL

## 2019-06-19 ENCOUNTER — Other Ambulatory Visit: Payer: Self-pay

## 2019-06-19 ENCOUNTER — Other Ambulatory Visit
Admission: RE | Admit: 2019-06-19 | Discharge: 2019-06-19 | Disposition: A | Discharge status: Home / Self Care | Payer: 59 | Source: Ambulatory Visit | Attending: Internal Medicine | Admitting: Internal Medicine

## 2019-06-19 DIAGNOSIS — Z20828 Contact with and (suspected) exposure to other viral communicable diseases: Secondary | ICD-10-CM | POA: Insufficient documentation

## 2019-06-19 DIAGNOSIS — Z01812 Encounter for preprocedural laboratory examination: Secondary | ICD-10-CM | POA: Diagnosis not present

## 2019-06-19 LAB — SARS CORONAVIRUS 2 (TAT 6-24 HRS): SARS Coronavirus 2: NEGATIVE

## 2019-06-20 ENCOUNTER — Encounter: Payer: Self-pay | Admitting: *Deleted

## 2019-06-23 ENCOUNTER — Ambulatory Visit
Admission: RE | Admit: 2019-06-23 | Discharge: 2019-06-23 | Disposition: A | Discharge status: Home / Self Care | Payer: 59 | Attending: Internal Medicine | Admitting: Internal Medicine

## 2019-06-23 ENCOUNTER — Encounter
Admission: RE | Disposition: A | Discharge status: Home / Self Care | Payer: Self-pay | Source: Home / Self Care | Attending: Internal Medicine

## 2019-06-23 ENCOUNTER — Ambulatory Visit: Payer: 59 | Admitting: Anesthesiology

## 2019-06-23 ENCOUNTER — Encounter: Payer: Self-pay | Admitting: *Deleted

## 2019-06-23 DIAGNOSIS — K640 First degree hemorrhoids: Secondary | ICD-10-CM | POA: Insufficient documentation

## 2019-06-23 DIAGNOSIS — Z79890 Hormone replacement therapy (postmenopausal): Secondary | ICD-10-CM | POA: Diagnosis not present

## 2019-06-23 DIAGNOSIS — Z7982 Long term (current) use of aspirin: Secondary | ICD-10-CM | POA: Diagnosis not present

## 2019-06-23 DIAGNOSIS — K219 Gastro-esophageal reflux disease without esophagitis: Secondary | ICD-10-CM | POA: Insufficient documentation

## 2019-06-23 DIAGNOSIS — Z1211 Encounter for screening for malignant neoplasm of colon: Secondary | ICD-10-CM | POA: Diagnosis not present

## 2019-06-23 DIAGNOSIS — K5730 Diverticulosis of large intestine without perforation or abscess without bleeding: Secondary | ICD-10-CM | POA: Insufficient documentation

## 2019-06-23 DIAGNOSIS — G4700 Insomnia, unspecified: Secondary | ICD-10-CM | POA: Insufficient documentation

## 2019-06-23 DIAGNOSIS — Z87891 Personal history of nicotine dependence: Secondary | ICD-10-CM | POA: Insufficient documentation

## 2019-06-23 DIAGNOSIS — E785 Hyperlipidemia, unspecified: Secondary | ICD-10-CM | POA: Insufficient documentation

## 2019-06-23 DIAGNOSIS — E039 Hypothyroidism, unspecified: Secondary | ICD-10-CM | POA: Diagnosis not present

## 2019-06-23 DIAGNOSIS — M797 Fibromyalgia: Secondary | ICD-10-CM | POA: Diagnosis not present

## 2019-06-23 DIAGNOSIS — F329 Major depressive disorder, single episode, unspecified: Secondary | ICD-10-CM | POA: Insufficient documentation

## 2019-06-23 DIAGNOSIS — F419 Anxiety disorder, unspecified: Secondary | ICD-10-CM | POA: Insufficient documentation

## 2019-06-23 DIAGNOSIS — Z79899 Other long term (current) drug therapy: Secondary | ICD-10-CM | POA: Insufficient documentation

## 2019-06-23 DIAGNOSIS — E119 Type 2 diabetes mellitus without complications: Secondary | ICD-10-CM | POA: Diagnosis not present

## 2019-06-23 HISTORY — DX: Unspecified osteoarthritis, unspecified site: M19.90

## 2019-06-23 HISTORY — DX: Insomnia, unspecified: G47.00

## 2019-06-23 HISTORY — DX: Hyperlipidemia, unspecified: E78.5

## 2019-06-23 HISTORY — PX: COLONOSCOPY WITH PROPOFOL: SHX5780

## 2019-06-23 HISTORY — DX: Aneurysm of unspecified site: I72.9

## 2019-06-23 HISTORY — DX: Depression, unspecified: F32.A

## 2019-06-23 HISTORY — DX: Disorder of thyroid, unspecified: E07.9

## 2019-06-23 LAB — GLUCOSE, CAPILLARY: Glucose-Capillary: 107 mg/dL — ABNORMAL HIGH (ref 70–99)

## 2019-06-23 SURGERY — COLONOSCOPY WITH PROPOFOL
Anesthesia: General

## 2019-06-23 MED ORDER — FENTANYL CITRATE (PF) 100 MCG/2ML IJ SOLN
INTRAMUSCULAR | Status: DC | PRN
  Administered 2019-06-23: 50 ug via INTRAVENOUS
  Administered 2019-06-23 (×2): 25 ug via INTRAVENOUS

## 2019-06-23 MED ORDER — PROPOFOL 500 MG/50ML IV EMUL
INTRAVENOUS | Status: DC | PRN
  Administered 2019-06-23: 150 ug/kg/min via INTRAVENOUS

## 2019-06-23 MED ORDER — PROPOFOL 10 MG/ML IV BOLUS
INTRAVENOUS | Status: DC | PRN
  Administered 2019-06-23: 20 mg via INTRAVENOUS
  Administered 2019-06-23: 10 mg via INTRAVENOUS
  Administered 2019-06-23: 50 mg via INTRAVENOUS

## 2019-06-23 MED ORDER — PROPOFOL 10 MG/ML IV BOLUS
INTRAVENOUS | Status: AC
  Filled 2019-06-23: qty 20

## 2019-06-23 MED ORDER — LIDOCAINE HCL (PF) 2 % IJ SOLN
INTRAMUSCULAR | Status: AC
  Filled 2019-06-23: qty 10

## 2019-06-23 MED ORDER — FENTANYL CITRATE (PF) 100 MCG/2ML IJ SOLN
INTRAMUSCULAR | Status: AC
  Filled 2019-06-23: qty 2

## 2019-06-23 MED ORDER — LIDOCAINE HCL (PF) 2 % IJ SOLN
INTRAMUSCULAR | Status: DC | PRN
  Administered 2019-06-23: 100 mg via INTRADERMAL

## 2019-06-23 MED ORDER — SODIUM CHLORIDE 0.9 % IV SOLN
INTRAVENOUS | Status: DC
  Administered 2019-06-23: 13:00:00 via INTRAVENOUS

## 2019-06-23 NOTE — Anesthesia Post-op Follow-up Note (Signed)
Anesthesia QCDR form completed.        

## 2019-06-23 NOTE — Interval H&P Note (Signed)
History and Physical Interval Note:  06/23/2019 1:45 PM  Michaela Caldwell  has presented today for surgery, with the diagnosis of COLON CANCER SCREENING.  The various methods of treatment have been discussed with the patient and family. After consideration of risks, benefits and other options for treatment, the patient has consented to  Procedure(s): COLONOSCOPY WITH PROPOFOL (N/A) as a surgical intervention.  The patient's history has been reviewed, patient examined, no change in status, stable for surgery.  I have reviewed the patient's chart and labs.  Questions were answered to the patient's satisfaction.     [place], [place]

## 2019-06-23 NOTE — H&P (Signed)
Outpatient short stay form Pre-procedure 06/23/2019 1:44 PM Teodoro K. Alice Reichert, M.D.  Primary Physician:  Frazier Richards, M.D.  Reason for visit:  Colon cancer screening  History of present illness:  Patient presents for colonoscopy for colon cancer screening. The patient denies complaints of abdominal pain, significant change in bowel habits, or rectal bleeding.     Current Facility-Administered Medications:  .  0.9 %  sodium chloride infusion, , Intravenous, Continuous, Kiana, Benay Pike, MD, Last Rate: 20 mL/hr at 06/23/19 1322  Medications Prior to Admission  Medication Sig Dispense Refill Last Dose  . aspirin EC 81 MG EC tablet Take 1 tablet (81 mg total) by mouth daily. 30 tablet 0 Past Week at Unknown time  . cyclobenzaprine (FLEXERIL) 5 MG tablet Take 5 mg by mouth 2 (two) times daily as needed for muscle spasms.   Past Week at Unknown time  . diclofenac (VOLTAREN) 75 MG EC tablet Take 1 tablet (75 mg total) by mouth at bedtime.   Past Week at Unknown time  . DULoxetine (CYMBALTA) 60 MG capsule Take 30 mg by mouth daily.    06/22/2019 at Unknown time  . gabapentin (NEURONTIN) 100 MG capsule Take 100 mg by mouth See admin instructions. Take 1 capsule (100mg ) by mouth twice to three times daily   06/23/2019 at 0700  . ibuprofen (ADVIL) 800 MG tablet Take 800 mg by mouth every 8 (eight) hours as needed.   Past Month at Unknown time  . levothyroxine (SYNTHROID, LEVOTHROID) 25 MCG tablet Take 25 mcg by mouth daily before breakfast.   06/22/2019 at Unknown time  . meclizine (ANTIVERT) 12.5 MG tablet Take 2 tablets (25 mg total) by mouth 3 (three) times daily as needed for dizziness. 30 tablet 0 Past Week at Unknown time  . omeprazole (PRILOSEC) 20 MG capsule Take 20 mg by mouth daily.   Past Week at Unknown time  . simvastatin (ZOCOR) 40 MG tablet Take 40 mg by mouth daily.   Past Week at Unknown time  . traMADol (ULTRAM) 50 MG tablet Take 50 mg by mouth 2 (two) times daily as needed  for pain.   Past Week at Unknown time  . traZODone (DESYREL) 50 MG tablet Take 50 mg by mouth at bedtime.   06/22/2019 at Unknown time  . vitamin B-12 (CYANOCOBALAMIN) 1000 MCG tablet Take 1,000 mcg by mouth daily.   Past Week at Unknown time  . Vitamin D, Ergocalciferol, (DRISDOL) 1.25 MG (50000 UT) CAPS capsule Take 50,000 Units by mouth every 7 (seven) days.   Past Week at Unknown time  . nortriptyline (PAMELOR) 10 MG capsule Take 10 mg by mouth at bedtime.   Not Taking at Unknown time     Allergies  Allergen Reactions  . Augmentin [Amoxicillin-Pot Clavulanate]     Diarrhea   . Cinnamon     Cough     Past Medical History:  Diagnosis Date  . Aneurysm ([place])   . Anxiety   . Arthritis   . Depression   . Diabetes mellitus without complication ([place])   . Elevated lipids   . Fibromyalgia   . GERD (gastroesophageal reflux disease)   . Headache   . Hyperlipidemia   . Hypothyroidism   . Insomnia   . Shoulder pain, right    elbow pain  . Thyroid disease     Review of systems:  Otherwise negative.    Physical Exam  Gen: Alert, oriented. Appears stated age.  HEENT: [place]/AT. PERRLA. Lungs: CTA, no  wheezes. CV: RR nl S1, S2. Abd: soft, benign, no masses. BS+ Ext: No edema. Pulses 2+    Planned procedures: Proceed with colonoscopy. The patient understands the nature of the planned procedure, indications, risks, alternatives and potential complications including but not limited to bleeding, infection, perforation, damage to internal organs and possible oversedation/side effects from anesthesia. The patient agrees and gives consent to proceed.  Please refer to procedure notes for findings, recommendations and patient disposition/instructions.     Teodoro K. Norma Fredrickson, M.D. Gastroenterology 06/23/2019  1:44 PM

## 2019-06-23 NOTE — Anesthesia Preprocedure Evaluation (Addendum)
Anesthesia Evaluation  Patient identified by MRN, date of birth, ID band Patient awake    Reviewed: Allergy & Precautions, NPO status , Patient's Chart, lab work & pertinent test results, reviewed documented beta blocker date and time   Airway Mallampati: III  TM Distance: >3 FB     Dental  (+) Chipped   Pulmonary former smoker,           Cardiovascular      Neuro/Psych  Headaches, PSYCHIATRIC DISORDERS Anxiety Depression  Neuromuscular disease    GI/Hepatic GERD  ,  Endo/Other  diabetes, Type 2Hypothyroidism   Renal/GU      Musculoskeletal  (+) Arthritis , Fibromyalgia -  Abdominal   Peds  Hematology   Anesthesia Other Findings   Reproductive/Obstetrics                             Anesthesia Physical Anesthesia Plan  ASA: III  Anesthesia Plan: General   Post-op Pain Management:    Induction: Intravenous  PONV Risk Score and Plan:   Airway Management Planned:   Additional Equipment:   Intra-op Plan:   Post-operative Plan:   Informed Consent: I have reviewed the patients History and Physical, chart, labs and discussed the procedure including the risks, benefits and alternatives for the proposed anesthesia with the patient or authorized representative who has indicated his/her understanding and acceptance.       Plan Discussed with: CRNA  Anesthesia Plan Comments:        Anesthesia Quick Evaluation

## 2019-06-23 NOTE — Op Note (Signed)
[facility] Gastroenterology Patient Name: Michaela Caldwell Procedure Date: 06/23/2019 1:51 PM MRN: [id number] Account #: [id number] Date of Birth: 1970/12/10 Admit Type: Outpatient Age: 48 Room: [facility] ENDO ROOM 2 Gender: Female Note Status: Finalized Procedure:            Colonoscopy Indications:          Screening for colorectal malignant neoplasm Providers:            Boykin Nearing. Norma Fredrickson MD, MD Referring MD:         Leotis Shames (Referring MD) Medicines:            Propofol per Anesthesia Complications:        No immediate complications. Procedure:            Pre-Anesthesia Assessment:                       - The risks and benefits of the procedure and the                        sedation options and risks were discussed with the                        patient. All questions were answered and informed                        consent was obtained.                       - Patient identification and proposed procedure were                        verified prior to the procedure by the nurse. The                        procedure was verified in the procedure room.                       - ASA Grade Assessment: III - A patient with severe                        systemic disease.                       - After reviewing the risks and benefits, the patient                        was deemed in satisfactory condition to undergo the                        procedure.                       After obtaining informed consent, the colonoscope was                        passed under direct vision. Throughout the procedure,                        the patient's blood pressure, pulse, and oxygen  saturations were monitored continuously. The                        Colonoscope was introduced through the anus and                        advanced to the the cecum, identified by appendiceal                        orifice and ileocecal valve. The colonoscopy was              performed without difficulty. The patient tolerated the                        procedure well. The quality of the bowel preparation                        was excellent. The ileocecal valve, appendiceal                        orifice, and rectum were photographed. Findings:      The perianal and digital rectal examinations were normal. Pertinent       negatives include normal sphincter tone and no palpable rectal lesions.      Many small and large-mouthed diverticula were found in the left colon.      Non-bleeding internal hemorrhoids were found during retroflexion. The       hemorrhoids were Grade I (internal hemorrhoids that do not prolapse).      The exam was otherwise without abnormality. Impression:           - Diverticulosis in the left colon.                       - Non-bleeding internal hemorrhoids.                       - The examination was otherwise normal.                       - No specimens collected. Recommendation:       - Patient has a contact number available for                        emergencies. The signs and symptoms of potential                        delayed complications were discussed with the patient.                        Return to normal activities tomorrow. Written discharge                        instructions were provided to the patient.                       - Resume previous diet.                       - Continue present medications.                       - High fiber diet.                       -  Return to GI office PRN. Procedure Code(s):    --- Professional ---                       F6384, Colorectal cancer screening; colonoscopy on                        individual not meeting criteria for high risk Diagnosis Code(s):    --- Professional ---                       K57.30, Diverticulosis of large intestine without                        perforation or abscess without bleeding                       K64.0, First degree hemorrhoids                        Z12.11, Encounter for screening for malignant neoplasm                        of colon CPT copyright 2019 American Medical Association. All rights reserved. The codes documented in this report are preliminary and upon coder review may  be revised to meet current compliance requirements. Efrain Sella MD, MD 06/23/2019 2:16:49 PM This report has been signed electronically. Number of Addenda: 0 Note Initiated On: 06/23/2019 1:51 PM Scope Withdrawal Time: 0 hours 6 minutes 1 second  Total Procedure Duration: 0 hours 12 minutes 23 seconds  Estimated Blood Loss: Estimated blood loss: none.      [facility]

## 2019-06-23 NOTE — Transfer of Care (Signed)
Immediate Anesthesia Transfer of Care Note  Patient: Michaela Caldwell  Procedure(s) Performed: COLONOSCOPY WITH PROPOFOL (N/A )  Patient Location: PACU  Anesthesia Type:General  Level of Consciousness: awake and drowsy  Airway & Oxygen Therapy: Patient Spontanous Breathing and Patient connected to face mask oxygen  Post-op Assessment: Report given to RN and Post -op Vital signs reviewed and stable  Post vital signs: Reviewed and stable  Last Vitals:  Vitals Value Taken Time  BP 124/69 06/23/19 1415  Temp    Pulse 75 06/23/19 1415  Resp 12 06/23/19 1415  SpO2 99 % 06/23/19 1415    Last Pain:  Vitals:   06/23/19 1303  TempSrc: Tympanic  PainSc: 5          Complications: No apparent anesthesia complications

## 2019-06-24 ENCOUNTER — Encounter: Payer: Self-pay | Admitting: Internal Medicine

## 2019-06-24 NOTE — Anesthesia Postprocedure Evaluation (Signed)
Anesthesia Post Note  Patient: Michaela Caldwell  Procedure(s) Performed: COLONOSCOPY WITH PROPOFOL (N/A )  Patient location during evaluation: Endoscopy Anesthesia Type: General Level of consciousness: awake and alert Pain management: pain level controlled Vital Signs Assessment: post-procedure vital signs reviewed and stable Respiratory status: spontaneous breathing, nonlabored ventilation, respiratory function stable and patient connected to nasal cannula oxygen Cardiovascular status: blood pressure returned to baseline and stable Postop Assessment: no apparent nausea or vomiting Anesthetic complications: no     Last Vitals:  Vitals:   06/23/19 1424 06/23/19 1434  BP: 109/71 126/80  Pulse: 75 61  Resp: 18 13  Temp:    SpO2: 100% 97%    Last Pain:  Vitals:   06/23/19 1444  TempSrc:   PainSc: 0-No pain                 Rushie Brazel S

## 2019-06-27 ENCOUNTER — Other Ambulatory Visit (HOSPITAL_COMMUNITY): Payer: Self-pay | Admitting: Neurology

## 2019-06-27 DIAGNOSIS — M79605 Pain in left leg: Secondary | ICD-10-CM

## 2019-06-27 DIAGNOSIS — M25551 Pain in right hip: Secondary | ICD-10-CM

## 2019-06-27 DIAGNOSIS — M25552 Pain in left hip: Secondary | ICD-10-CM

## 2019-06-27 DIAGNOSIS — M79604 Pain in right leg: Secondary | ICD-10-CM

## 2019-07-10 ENCOUNTER — Other Ambulatory Visit: Payer: Self-pay

## 2019-07-10 ENCOUNTER — Ambulatory Visit
Admission: RE | Admit: 2019-07-10 | Discharge: 2019-07-10 | Disposition: A | Discharge status: Home / Self Care | Payer: 59 | Source: Ambulatory Visit | Attending: Neurology | Admitting: Neurology

## 2019-07-10 DIAGNOSIS — M25552 Pain in left hip: Secondary | ICD-10-CM | POA: Diagnosis present

## 2019-07-10 DIAGNOSIS — M79604 Pain in right leg: Secondary | ICD-10-CM | POA: Diagnosis present

## 2019-07-10 DIAGNOSIS — M25551 Pain in right hip: Secondary | ICD-10-CM | POA: Insufficient documentation

## 2019-07-10 DIAGNOSIS — M79605 Pain in left leg: Secondary | ICD-10-CM | POA: Diagnosis present

## 2019-07-15 ENCOUNTER — Ambulatory Visit
Admission: RE | Admit: 2019-07-15 | Discharge: 2019-07-15 | Disposition: A | Discharge status: Home / Self Care | Payer: 59 | Source: Ambulatory Visit | Attending: Internal Medicine | Admitting: Internal Medicine

## 2019-07-15 DIAGNOSIS — Z1231 Encounter for screening mammogram for malignant neoplasm of breast: Secondary | ICD-10-CM | POA: Diagnosis not present

## 2019-07-25 ENCOUNTER — Emergency Department
Admission: EM | Admit: 2019-07-25 | Discharge: 2019-07-25 | Disposition: A | Discharge status: Home / Self Care | Payer: 59 | Attending: Emergency Medicine | Admitting: Emergency Medicine

## 2019-07-25 ENCOUNTER — Other Ambulatory Visit: Payer: Self-pay

## 2019-07-25 ENCOUNTER — Encounter: Payer: Self-pay | Admitting: Emergency Medicine

## 2019-07-25 ENCOUNTER — Emergency Department: Payer: 59

## 2019-07-25 DIAGNOSIS — Z7982 Long term (current) use of aspirin: Secondary | ICD-10-CM | POA: Diagnosis not present

## 2019-07-25 DIAGNOSIS — E039 Hypothyroidism, unspecified: Secondary | ICD-10-CM | POA: Diagnosis not present

## 2019-07-25 DIAGNOSIS — M545 Low back pain: Secondary | ICD-10-CM | POA: Diagnosis present

## 2019-07-25 DIAGNOSIS — M5126 Other intervertebral disc displacement, lumbar region: Secondary | ICD-10-CM | POA: Insufficient documentation

## 2019-07-25 DIAGNOSIS — M47816 Spondylosis without myelopathy or radiculopathy, lumbar region: Secondary | ICD-10-CM | POA: Diagnosis not present

## 2019-07-25 DIAGNOSIS — Z79899 Other long term (current) drug therapy: Secondary | ICD-10-CM | POA: Insufficient documentation

## 2019-07-25 DIAGNOSIS — M5416 Radiculopathy, lumbar region: Secondary | ICD-10-CM | POA: Insufficient documentation

## 2019-07-25 DIAGNOSIS — M5136 Other intervertebral disc degeneration, lumbar region: Secondary | ICD-10-CM

## 2019-07-25 DIAGNOSIS — Z87891 Personal history of nicotine dependence: Secondary | ICD-10-CM | POA: Insufficient documentation

## 2019-07-25 MED ORDER — PREDNISONE 10 MG PO TABS: 10.0000 mg | ORAL_TABLET | Freq: Every day | ORAL | 0 refills | Status: DC

## 2019-07-25 MED ORDER — OXYCODONE-ACETAMINOPHEN 5-325 MG PO TABS: 1.0000 | ORAL_TABLET | Freq: Once | ORAL | Status: DC

## 2019-07-25 MED ORDER — OXYCODONE-ACETAMINOPHEN 7.5-325 MG PO TABS
1.0000 | ORAL_TABLET | Freq: Once | ORAL | Status: AC
  Administered 2019-07-25: 1 via ORAL
  Filled 2019-07-25: qty 1

## 2019-07-25 MED ORDER — OXYCODONE-ACETAMINOPHEN 5-325 MG PO TABS: 1.0000 | ORAL_TABLET | ORAL | 0 refills | Status: DC | PRN

## 2019-07-25 MED ORDER — DEXAMETHASONE SODIUM PHOSPHATE 10 MG/ML IJ SOLN
10.0000 mg | Freq: Once | INTRAMUSCULAR | Status: AC
  Administered 2019-07-25: 10 mg via INTRAMUSCULAR
  Filled 2019-07-25: qty 1

## 2019-07-25 MED ORDER — OXYCODONE-ACETAMINOPHEN 5-325 MG PO TABS
2.0000 | ORAL_TABLET | Freq: Once | ORAL | Status: AC
  Administered 2019-07-25: 2 via ORAL
  Filled 2019-07-25: qty 2

## 2019-07-25 MED ORDER — KETOROLAC TROMETHAMINE 30 MG/ML IJ SOLN
30.0000 mg | Freq: Once | INTRAMUSCULAR | Status: AC
  Administered 2019-07-25: 30 mg via INTRAMUSCULAR
  Filled 2019-07-25: qty 1

## 2019-07-25 NOTE — ED Notes (Addendum)
See triage note  Presents with lower back pain   States this is a chronic problem but today felt a pop when getting out of car today  Pain is moving into left leg

## 2019-07-25 NOTE — ED Provider Notes (Signed)
[facility] Emergency Department Provider Note  ____________________________________________  Time seen: Approximately 3:50 PM  I have reviewed the triage vital signs and the nursing notes.   HISTORY  Chief Complaint Back Pain   HPI EMAAN Caldwell is a 48 y.o. female presents to the emergency department for treatment and evaluation of low back pain.  She recently had an MRI of the lumbar spine that showed 2 herniated disc.  Patient states that she was getting out of her car today and she just felt something pop.  Pain now radiates down her left leg.  She has had similar pain in the past but it did not involve the front of the left lower extremity.  She is scheduled for "injections."  No relief with her normal pain medication regimen.  Past Medical History:  Diagnosis Date  . Aneurysm ([place])   . Anxiety   . Arthritis   . Depression   . Diabetes mellitus without complication ([place])   . Elevated lipids   . Fibromyalgia   . GERD (gastroesophageal reflux disease)   . Headache   . Hyperlipidemia   . Hypothyroidism   . Insomnia   . Shoulder pain, right    elbow pain  . Thyroid disease     Patient Active Problem List   Diagnosis Date Noted  . Dizziness 06/06/2019  . Postoperative state 10/21/2018    Past Surgical History:  Procedure Laterality Date  . ABDOMINAL HYSTERECTOMY    . CESAREAN SECTION    . COLONOSCOPY WITH PROPOFOL N/A 06/23/2019   Procedure: COLONOSCOPY WITH PROPOFOL;  Surgeon: Toledo, Benay Pike, MD;  Location: [facility] ENDOSCOPY;  Service: Gastroenterology;  Laterality: N/A;  . dislocated jaw    . EXCISION/RELEASE BURSA HIP    . KNEE ARTHROSCOPY Right   . LAPAROSCOPIC VAGINAL HYSTERECTOMY WITH SALPINGO OOPHORECTOMY Bilateral 10/21/2018   Procedure: LAPAROSCOPIC ASSISTED VAGINAL HYSTERECTOMY WITH SALPINGO OOPHORECTOMY;  Surgeon: Schermerhorn, Gwen Her, MD;  Location: [facility] ORS;  Service: Gynecology;  Laterality: Bilateral;  . PLANTAR  FASCIECTOMY    . SHOULDER ARTHROSCOPY Right   . uterine ablation      Prior to Admission medications   Medication Sig Start Date End Date Taking? Authorizing Provider  aspirin EC 81 MG EC tablet Take 1 tablet (81 mg total) by mouth daily. 06/07/19   Loletha Grayer, MD  cyclobenzaprine (FLEXERIL) 5 MG tablet Take 5 mg by mouth 2 (two) times daily as needed for muscle spasms.    [provider]  diclofenac (VOLTAREN) 75 MG EC tablet Take 1 tablet (75 mg total) by mouth at bedtime. 06/07/19   Loletha Grayer, MD  DULoxetine (CYMBALTA) 60 MG capsule Take 30 mg by mouth daily.     [provider]  gabapentin (NEURONTIN) 100 MG capsule Take 100 mg by mouth See admin instructions. Take 1 capsule (100mg ) by mouth twice to three times daily    [provider]  ibuprofen (ADVIL) 800 MG tablet Take 800 mg by mouth every 8 (eight) hours as needed.    [provider]  levothyroxine (SYNTHROID, LEVOTHROID) 25 MCG tablet Take 25 mcg by mouth daily before breakfast.    [provider]  meclizine (ANTIVERT) 12.5 MG tablet Take 2 tablets (25 mg total) by mouth 3 (three) times daily as needed for dizziness. 06/07/19   Loletha Grayer, MD  nortriptyline (PAMELOR) 10 MG capsule Take 10 mg by mouth at bedtime.    [provider]  omeprazole (PRILOSEC) 20 MG capsule Take 20  mg by mouth daily.    [provider]  oxyCODONE-acetaminophen (PERCOCET) 5-325 MG tablet Take 1 tablet by mouth every 4 (four) hours as needed for severe pain. 07/25/19 07/24/20  Evon Slack, PA-C  predniSONE (DELTASONE) 10 MG tablet Take 1 tablet (10 mg total) by mouth daily. 6,5,4,3,2,1 six day taper 07/25/19   Evon Slack, PA-C  simvastatin (ZOCOR) 40 MG tablet Take 40 mg by mouth daily.    [provider]  traMADol (ULTRAM) 50 MG tablet Take 50 mg by mouth 2 (two) times daily as needed for pain. 06/04/19   [provider]  traZODone (DESYREL) 50 MG  tablet Take 50 mg by mouth at bedtime. 06/05/19   [provider]  vitamin B-12 (CYANOCOBALAMIN) 1000 MCG tablet Take 1,000 mcg by mouth daily.    [provider]  Vitamin D, Ergocalciferol, (DRISDOL) 1.25 MG (50000 UT) CAPS capsule Take 50,000 Units by mouth every 7 (seven) days.    [provider]    Allergies Augmentin [amoxicillin-pot clavulanate] and Cinnamon  Family History  Problem Relation Age of Onset  . Breast cancer Maternal Aunt 50  . Breast cancer Paternal Aunt 72    Social History Social History   Tobacco Use  . Smoking status: Former Smoker    Quit date: 07/15/2018    Years since quitting: 1.0  . Smokeless tobacco: Never Used  Substance Use Topics  . Alcohol use: Never    Frequency: Never  . Drug use: Never    Review of Systems Constitutional: Negative for fever. ENT: Negative for sore throat. Respiratory: Negative for shortness of breath Gastrointestinal: No abdominal pain.  No nausea, no vomiting.  No diarrhea.  Musculoskeletal: Positive for left lower back pain. Skin: Negative for rash/lesion/wound. Neurological: Negative for headaches, focal weakness or numbness.  ____________________________________________   PHYSICAL EXAM:  VITAL SIGNS:  Today's Vitals   07/25/19 1552 07/25/19 1802 07/25/19 1915 07/25/19 1947  BP: (!) 147/79  121/70 136/90  Pulse: 94  68 70  Resp: 20  16 16   Temp: 98.3 F (36.8 C)   98.4 F (36.9 C)  TempSrc: Oral   Oral  SpO2: 97%  96% 95%  Weight:      Height:      PainSc:  8   4    Body mass index is 33.89 kg/m.  Constitutional: Alert and oriented. Uncomfortable appearing and in no acute distress. Neck: No stridor.  Cardiovascular: Good peripheral circulation. Respiratory: Normal respiratory effort. Musculoskeletal: Guarded ROM of the lower extremities.  Neurologic:  Normal speech and language. No gross focal neurologic deficits are appreciated. Speech is normal.  Skin:  Skin is warm,  dry and intact.  Psychiatric: Mood and affect are normal. Speech and behavior are normal.  ____________________________________________   LABS (all labs ordered are listed, but only abnormal results are displayed)  Labs Reviewed - No data to display ____________________________________________  EKG   ____________________________________________  RADIOLOGY   ____________________________________________   PROCEDURES   ____________________________________________   INITIAL IMPRESSION / ASSESSMENT AND PLAN / ED COURSE     Patient was advised to stay in the waiting area until called to ER room. ____________________________________________   FINAL CLINICAL IMPRESSION(S) / ED DIAGNOSES  Final diagnoses:  Left lumbar radiculopathy  Bulging lumbar disc  Lumbar spondylosis       , FNP 07/25/19 2158    2159, MD 07/26/19 1557

## 2019-07-25 NOTE — ED Triage Notes (Signed)
Pt reports back pain, had a MRI done and it showed 2 herniated discs. Pt states getting out of a car today and she felt something pop and now the pain radiates down her left leg.

## 2019-07-25 NOTE — ED Provider Notes (Signed)
[facility] EMERGENCY DEPARTMENT Provider Note   CSN: [id number] Arrival date & time: 07/25/19  1504     History   Chief Complaint Chief Complaint  Patient presents with  . Back Pain    HPI Michaela Caldwell is a 48 y.o. female presents to the emergency department for evaluation of acute low back pain.  Patient has a history of lower back pain with multifactorial stenosis contributing to left lumbar radiculopathy.  Yesterday she had a fall in the woods, fell on her buttocks and developed increasing low back pain with burning numbness and tingling rating down the left posterior thigh, posterior calf and into the bottom of her left foot.  She is currently taking tramadol and Flexeril with no improvement.  Her pain is severe.  She is able to ambulate.  No loss of bowel or bladder symptoms.  No other injuries to her body.  Her fall was mechanical.  No chest pain, shortness of breath or abdominal pain.     HPI  Past Medical History:  Diagnosis Date  . Aneurysm ([facility])   . Anxiety   . Arthritis   . Depression   . Diabetes mellitus without complication ([facility])   . Elevated lipids   . Fibromyalgia   . GERD (gastroesophageal reflux disease)   . Headache   . Hyperlipidemia   . Hypothyroidism   . Insomnia   . Shoulder pain, right    elbow pain  . Thyroid disease     Patient Active Problem List   Diagnosis Date Noted  . Dizziness 06/06/2019  . Postoperative state 10/21/2018    Past Surgical History:  Procedure Laterality Date  . ABDOMINAL HYSTERECTOMY    . CESAREAN SECTION    . COLONOSCOPY WITH PROPOFOL N/A 06/23/2019   Procedure: COLONOSCOPY WITH PROPOFOL;  Surgeon: Toledo, Boykin Nearing, MD;  Location: [facility] ENDOSCOPY;  Service: Gastroenterology;  Laterality: N/A;  . dislocated jaw    . EXCISION/RELEASE BURSA HIP    . KNEE ARTHROSCOPY Right   . LAPAROSCOPIC VAGINAL HYSTERECTOMY WITH SALPINGO OOPHORECTOMY Bilateral 10/21/2018   Procedure: LAPAROSCOPIC ASSISTED  VAGINAL HYSTERECTOMY WITH SALPINGO OOPHORECTOMY;  Surgeon: Schermerhorn, Ihor Austin, MD;  Location: [facility] ORS;  Service: Gynecology;  Laterality: Bilateral;  . PLANTAR FASCIECTOMY    . SHOULDER ARTHROSCOPY Right   . uterine ablation       OB History   No obstetric history on file.      Home Medications    Prior to Admission medications   Medication Sig Start Date End Date Taking? Authorizing Provider  aspirin EC 81 MG EC tablet Take 1 tablet (81 mg total) by mouth daily. 06/07/19   Alford Highland, MD  cyclobenzaprine (FLEXERIL) 5 MG tablet Take 5 mg by mouth 2 (two) times daily as needed for muscle spasms.    [provider]  diclofenac (VOLTAREN) 75 MG EC tablet Take 1 tablet (75 mg total) by mouth at bedtime. 06/07/19   Alford Highland, MD  DULoxetine (CYMBALTA) 60 MG capsule Take 30 mg by mouth daily.     [provider]  gabapentin (NEURONTIN) 100 MG capsule Take 100 mg by mouth See admin instructions. Take 1 capsule ( ) by mouth twice to three times daily    [provider]  ibuprofen (ADVIL) 800 MG tablet Take 800 mg by mouth every 8 (eight) hours as needed.    [provider]  levothyroxine (SYNTHROID, LEVOTHROID) 25 MCG tablet Take 25 mcg by mouth daily before breakfast.  [provider]  meclizine (ANTIVERT) 12.5 MG tablet Take 2 tablets (25 mg total) by mouth 3 (three) times daily as needed for dizziness. 06/07/19   Alford HighlandWieting, Richard, MD  nortriptyline (PAMELOR) 10 MG capsule Take 10 mg by mouth at bedtime.    [provider]  omeprazole (PRILOSEC) 20 MG capsule Take 20 mg by mouth daily.    [provider]  oxyCODONE-acetaminophen (PERCOCET) 5-325 MG tablet Take 1 tablet by mouth every 4 (four) hours as needed for severe pain. 07/25/19 07/24/20  Evon SlackGaines, Thomas C, PA-C  predniSONE (DELTASONE) 10 MG tablet Take 1 tablet (10 mg total) by mouth daily. 6,5,4,3,2,1 six day taper 07/25/19   Evon SlackGaines, Thomas C, PA-C   simvastatin (ZOCOR) 40 MG tablet Take 40 mg by mouth daily.    [provider]  traMADol (ULTRAM) 50 MG tablet Take 50 mg by mouth 2 (two) times daily as needed for pain. 06/04/19   [provider]  traZODone (DESYREL) 50 MG tablet Take 50 mg by mouth at bedtime. 06/05/19   [provider]  vitamin B-12 (CYANOCOBALAMIN) 1000 MCG tablet Take 1,000 mcg by mouth daily.    [provider]  Vitamin D, Ergocalciferol, (DRISDOL) 1.25 MG (50000 UT) CAPS capsule Take 50,000 Units by mouth every 7 (seven) days.    [provider]    Family History Family History  Problem Relation Age of Onset  . Breast cancer Maternal Aunt 50  . Breast cancer Paternal Aunt 7060    Social History Social History   Tobacco Use  . Smoking status: Former Smoker    Quit date: 07/15/2018    Years since quitting: 1.0  . Smokeless tobacco: Never Used  Substance Use Topics  . Alcohol use: Never    Frequency: Never  . Drug use: Never     Allergies   Augmentin [amoxicillin-pot clavulanate] and Cinnamon   Review of Systems Review of Systems  Constitutional: Negative for fever.  Respiratory: Negative for shortness of breath.   Cardiovascular: Negative for chest pain.  Gastrointestinal: Negative for abdominal pain.  Musculoskeletal: Positive for back pain. Negative for gait problem, joint swelling and myalgias.  Neurological: Positive for numbness. Negative for dizziness, weakness and headaches.     Physical Exam Updated Vital Signs BP (!) 147/79 (BP Location: Left Arm)   Pulse 94   Temp 98.3 F (36.8 C) (Oral)   Resp 20   Ht 5\' 6"  (1.676 m)   Wt 95.3 kg   LMP 10/15/2013   SpO2 97%   BMI 33.89 kg/m   Physical Exam Constitutional:      Appearance: She is well-developed.  HENT:     Head: Normocephalic and atraumatic.     Right Ear: External ear normal.     Left Ear: External ear normal.     Nose: Nose normal.  Eyes:     Conjunctiva/sclera: Conjunctivae  normal.     Pupils: Pupils are equal, round, and reactive to light.  Neck:     Musculoskeletal: Normal range of motion.  Cardiovascular:     Rate and Rhythm: Normal rate.  Pulmonary:     Effort: Pulmonary effort is normal. No respiratory distress.     Breath sounds: Normal breath sounds.  Abdominal:     Palpations: Abdomen is soft.     Tenderness: There is no abdominal tenderness.  Musculoskeletal:        General: No deformity.     Comments: Lumbar Spine: Examination of the lumbar spine reveals  no bony abnormality, no edema, and no ecchymosis.  There is no step off.  The patient has decreased range of motion of the lumbar spine with flexion and extension.  The patient has normal lateral bend and rotation.  The patient has pain with lumbar flexion and extension..  The patient has a negative axial load test, and a negative rotational Waddell test.  The patient is non tender along the spinous process.  The patient is non tender along the paravertebral muscles, with no muscle spasms.  The patient is non tender along the iliac crest.  The patient is non tender in the sciatic notch.  The patient is non tender along the Sacroiliac joint.  There is no Coccyx joint tenderness.    Bilateral Lower Extremities: Examination of the lower extremities reveals no bony abnormality, no edema, and no ecchymosis.  The patient has full active and passive range of motion of the hips, knees, and ankles.  There is no discomfort with range of motion exercises.  The patient is non tender along the greater trochanter region.  The patient has a negative Denna Haggard' test bilaterally.  There is normal skin warmth.  There is normal capillary refill bilaterally.  Normal range of motion of the left hip with no discomfort with internal or external rotation.  Neurologic: The patient has a negative straight leg raise.  The patient has normal muscle strength testing for the quadriceps, calves, ankle dorsiflexion, ankle plantarflexion,  and extensor hallicus longus.  The patient has sensation that is intact to light touch.   Skin:    General: Skin is warm and dry.     Findings: No rash.  Neurological:     Mental Status: She is alert and oriented to person, place, and time.     Cranial Nerves: No cranial nerve deficit.     Coordination: Coordination normal.  Psychiatric:        Behavior: Behavior normal.        Thought Content: Thought content normal.      ED Treatments / Results  Labs (all labs ordered are listed, but only abnormal results are displayed) Labs Reviewed - No data to display  EKG None  Radiology Dg Lumbar Spine 2-3 Views  Result Date: 07/25/2019 CLINICAL DATA:  Pain status post fall EXAM: LUMBAR SPINE - 2-3 VIEW COMPARISON:  None. FINDINGS: There is no evidence of lumbar spine fracture. Alignment is normal. Intervertebral disc spaces are maintained. IMPRESSION: Negative. Electronically Signed   By: Katherine Mantle M.D.   On: 07/25/2019 18:30    Procedures Procedures (including critical care time)  Medications Ordered in ED Medications  oxyCODONE-acetaminophen (PERCOCET/ROXICET) 5-325 MG per tablet 2 tablet (2 tablets Oral Given 07/25/19 1554)  ketorolac (TORADOL) 30 MG/ML injection 30 mg (30 mg Intramuscular Given 07/25/19 1806)  dexamethasone (DECADRON) injection 10 mg (10 mg Intramuscular Given 07/25/19 1806)  oxyCODONE-acetaminophen (PERCOCET) 7.5-325 MG per tablet 1 tablet (1 tablet Oral Given 07/25/19 1805)     Initial Impression / Assessment and Plan / ED Course  I have reviewed the triage vital signs and the nursing notes.  Pertinent labs & imaging results that were available during my care of the patient were reviewed by me and considered in my medical decision making (see chart for details).        48 year old female with left lumbar radiculopathy, acute flareup after a fall yesterday.  X-ray showed no evidence of acute bony abnormality lumbar spine.  Recent MRI showing  L4-L5 and L5-S1 multifactorial stenosis  with possible impingement of the left S1 nerve root.  Her radicular symptoms are consistent with S1 radiculopathy.  No weakness or neurological deficits.  She is given IM dexamethasone and Toradol, a home 6-day steroid taper as well as oxycodone.  She understands signs symptoms return to ED for.  She will follow-up with physician next week.  Final Clinical Impressions(s) / ED Diagnoses   Final diagnoses:  Left lumbar radiculopathy  Bulging lumbar disc  Lumbar spondylosis    ED Discharge Orders         Ordered    predniSONE (DELTASONE) 10 MG tablet  Daily     07/25/19 1853    oxyCODONE-acetaminophen (PERCOCET) 5-325 MG tablet  Every 4 hours PRN     07/25/19 1853           Duanne Guess, PA-C 07/25/19 Freddi Che, MD 07/25/19 1950

## 2019-07-25 NOTE — Discharge Instructions (Addendum)
Please take oxycodone as needed for severe pain.  Take prednisone as prescribed.  Return to the ER for any weakness, loss of bowel or bladder symptoms, worsening symptoms or urgent changes in health.

## 2019-07-25 NOTE — ED Notes (Signed)
Pt leaving for imaging. Will give meds once back.

## 2019-07-31 ENCOUNTER — Encounter: Payer: Self-pay | Admitting: Emergency Medicine

## 2019-07-31 ENCOUNTER — Emergency Department
Admission: EM | Admit: 2019-07-31 | Discharge: 2019-07-31 | Disposition: A | Discharge status: Home / Self Care | Payer: 59 | Attending: Emergency Medicine | Admitting: Emergency Medicine

## 2019-07-31 ENCOUNTER — Emergency Department: Payer: 59

## 2019-07-31 ENCOUNTER — Other Ambulatory Visit: Payer: Self-pay

## 2019-07-31 DIAGNOSIS — Z87891 Personal history of nicotine dependence: Secondary | ICD-10-CM | POA: Diagnosis not present

## 2019-07-31 DIAGNOSIS — Z79899 Other long term (current) drug therapy: Secondary | ICD-10-CM | POA: Insufficient documentation

## 2019-07-31 DIAGNOSIS — S8991XA Unspecified injury of right lower leg, initial encounter: Secondary | ICD-10-CM | POA: Diagnosis present

## 2019-07-31 DIAGNOSIS — Z7982 Long term (current) use of aspirin: Secondary | ICD-10-CM | POA: Insufficient documentation

## 2019-07-31 DIAGNOSIS — Y9389 Activity, other specified: Secondary | ICD-10-CM | POA: Insufficient documentation

## 2019-07-31 DIAGNOSIS — I10 Essential (primary) hypertension: Secondary | ICD-10-CM | POA: Diagnosis not present

## 2019-07-31 DIAGNOSIS — Y999 Unspecified external cause status: Secondary | ICD-10-CM | POA: Insufficient documentation

## 2019-07-31 DIAGNOSIS — X500XXA Overexertion from strenuous movement or load, initial encounter: Secondary | ICD-10-CM | POA: Insufficient documentation

## 2019-07-31 DIAGNOSIS — Y929 Unspecified place or not applicable: Secondary | ICD-10-CM | POA: Insufficient documentation

## 2019-07-31 DIAGNOSIS — E119 Type 2 diabetes mellitus without complications: Secondary | ICD-10-CM | POA: Diagnosis not present

## 2019-07-31 DIAGNOSIS — E039 Hypothyroidism, unspecified: Secondary | ICD-10-CM | POA: Diagnosis not present

## 2019-07-31 DIAGNOSIS — S83104A Unspecified dislocation of right knee, initial encounter: Secondary | ICD-10-CM | POA: Insufficient documentation

## 2019-07-31 LAB — CBC WITH DIFFERENTIAL/PLATELET
Abs Immature Granulocytes: 0.12 10*3/uL — ABNORMAL HIGH (ref 0.00–0.07)
Basophils Absolute: 0 10*3/uL (ref 0.0–0.1)
Basophils Relative: 0 %
Eosinophils Absolute: 0 10*3/uL (ref 0.0–0.5)
Eosinophils Relative: 0 %
HCT: 49.7 % — ABNORMAL HIGH (ref 36.0–46.0)
Hemoglobin: 16.3 g/dL — ABNORMAL HIGH (ref 12.0–15.0)
Immature Granulocytes: 1 %
Lymphocytes Relative: 8 %
Lymphs Abs: 1.1 10*3/uL (ref 0.7–4.0)
MCH: 28.5 pg (ref 26.0–34.0)
MCHC: 32.8 g/dL (ref 30.0–36.0)
MCV: 86.9 fL (ref 80.0–100.0)
Monocytes Absolute: 0.2 10*3/uL (ref 0.1–1.0)
Monocytes Relative: 1 %
Neutro Abs: 11.7 10*3/uL — ABNORMAL HIGH (ref 1.7–7.7)
Neutrophils Relative %: 90 %
Platelets: 322 10*3/uL (ref 150–400)
RBC: 5.72 MIL/uL — ABNORMAL HIGH (ref 3.87–5.11)
RDW: 13.8 % (ref 11.5–15.5)
WBC: 13.1 10*3/uL — ABNORMAL HIGH (ref 4.0–10.5)
nRBC: 0 % (ref 0.0–0.2)

## 2019-07-31 LAB — BASIC METABOLIC PANEL
Anion gap: 13 (ref 5–15)
BUN: 21 mg/dL — ABNORMAL HIGH (ref 6–20)
CO2: 22 mmol/L (ref 22–32)
Calcium: 9 mg/dL (ref 8.9–10.3)
Chloride: 103 mmol/L (ref 98–111)
Creatinine, Ser: 0.89 mg/dL (ref 0.44–1.00)
GFR calc Af Amer: >60 mL/min (ref >60)
GFR calc non Af Amer: >60 mL/min (ref >60)
Glucose, Bld: 206 mg/dL — ABNORMAL HIGH (ref 70–99)
Potassium: 4.4 mmol/L (ref 3.5–5.1)
Sodium: 138 mmol/L (ref 135–145)

## 2019-07-31 MED ORDER — ETOMIDATE 2 MG/ML IV SOLN
INTRAVENOUS | Status: AC
  Filled 2019-07-31: qty 10

## 2019-07-31 MED ORDER — HYDROMORPHONE HCL 1 MG/ML IJ SOLN
0.5000 mg | Freq: Once | INTRAMUSCULAR | Status: AC
  Administered 2019-07-31: 0.5 mg via INTRAVENOUS
  Filled 2019-07-31: qty 1

## 2019-07-31 MED ORDER — IOHEXOL 350 MG/ML SOLN
125.0000 mL | Freq: Once | INTRAVENOUS | Status: AC | PRN
  Administered 2019-07-31: 19:00:00 125 mL via INTRAVENOUS

## 2019-07-31 MED ORDER — ETOMIDATE 2 MG/ML IV SOLN
10.0000 mg | Freq: Once | INTRAVENOUS | Status: AC
  Administered 2019-07-31: 10 mg via INTRAVENOUS
  Filled 2019-07-31: qty 10

## 2019-07-31 NOTE — ED Notes (Signed)
Patient transported to CT 

## 2019-07-31 NOTE — ED Provider Notes (Signed)
[facility] Emergency Department Provider Note  ____________________________________________   First MD Initiated Contact with Patient 07/31/19 1726     (approximate)  I have reviewed the triage vital signs and the nursing notes.   HISTORY  Chief Complaint Knee Pain    HPI Michaela Caldwell is a 48 y.o. female with diabetes, hypertension, hyperlipidemia who presents with knee pain.  Patient had an epidural done on the left side this morning herniated disc.  Patient was moving from the couch and her knee went out.  History of knee dislocation 10 years ago.  This happened just prior to arrival.  Patient had multiple surgeries on the knee as well as the prior dislocation.  She says in the past they have had to sedate her in order to extend it and relocated.  Her pain is severe, constant, worse with moving, better at rest.          Past Medical History:  Diagnosis Date   Aneurysm (HCC)    Anxiety    Arthritis    Depression    Diabetes mellitus without complication (HCC)    Elevated lipids    Fibromyalgia    GERD (gastroesophageal reflux disease)    Headache    Hyperlipidemia    Hypothyroidism    Insomnia    Shoulder pain, right    elbow pain   Thyroid disease     Patient Active Problem List   Diagnosis Date Noted   Dizziness 06/06/2019   Postoperative state 10/21/2018    Past Surgical History:  Procedure Laterality Date   ABDOMINAL HYSTERECTOMY     CESAREAN SECTION     COLONOSCOPY WITH PROPOFOL N/A 06/23/2019   Procedure: COLONOSCOPY WITH PROPOFOL;  Surgeon: Toledo, Boykin Nearing, MD;  Location: [facility] ENDOSCOPY;  Service: Gastroenterology;  Laterality: N/A;   dislocated jaw     EXCISION/RELEASE BURSA HIP     KNEE ARTHROSCOPY Right    LAPAROSCOPIC VAGINAL HYSTERECTOMY WITH SALPINGO OOPHORECTOMY Bilateral 10/21/2018   Procedure: LAPAROSCOPIC ASSISTED VAGINAL HYSTERECTOMY WITH SALPINGO OOPHORECTOMY;  Surgeon: Schermerhorn,  Ihor Austin, MD;  Location: [facility] ORS;  Service: Gynecology;  Laterality: Bilateral;   PLANTAR FASCIECTOMY     SHOULDER ARTHROSCOPY Right    uterine ablation      Prior to Admission medications   Medication Sig Start Date End Date Taking? Authorizing Provider  aspirin EC 81 MG EC tablet Take 1 tablet (81 mg total) by mouth daily. 06/07/19   Alford Highland, MD  cyclobenzaprine (FLEXERIL) 5 MG tablet Take 5 mg by mouth 2 (two) times daily as needed for muscle spasms.    [provider]  diclofenac (VOLTAREN) 75 MG EC tablet Take 1 tablet (75 mg total) by mouth at bedtime. 06/07/19   Alford Highland, MD  DULoxetine (CYMBALTA) 60 MG capsule Take 30 mg by mouth daily.     [provider]  gabapentin (NEURONTIN) 100 MG capsule Take 100 mg by mouth See admin instructions. Take 1 capsule ( ) by mouth twice to three times daily    [provider]  ibuprofen (ADVIL) 800 MG tablet Take 800 mg by mouth every 8 (eight) hours as needed.    [provider]  levothyroxine (SYNTHROID, LEVOTHROID) 25 MCG tablet Take 25 mcg by mouth daily before breakfast.    [provider]  meclizine (ANTIVERT) 12.5 MG tablet Take 2 tablets (25 mg total) by mouth 3 (three) times daily as needed for dizziness. 06/07/19   Alford Highland, MD  nortriptyline [facility])  10 MG capsule Take 10 mg by mouth at bedtime.    [provider]  omeprazole (PRILOSEC) 20 MG capsule Take 20 mg by mouth daily.    [provider]  oxyCODONE-acetaminophen (PERCOCET) 5-325 MG tablet Take 1 tablet by mouth every 4 (four) hours as needed for severe pain. 07/25/19 07/24/20  Evon Slack, PA-C  predniSONE (DELTASONE) 10 MG tablet Take 1 tablet (10 mg total) by mouth daily. 6,5,4,3,2,1 six day taper 07/25/19   Evon Slack, PA-C  simvastatin (ZOCOR) 40 MG tablet Take 40 mg by mouth daily.    [provider]  traMADol (ULTRAM) 50 MG tablet Take 50 mg by mouth 2 (two) times  daily as needed for pain. 06/04/19   [provider]  traZODone (DESYREL) 50 MG tablet Take 50 mg by mouth at bedtime. 06/05/19   [provider]  vitamin B-12 (CYANOCOBALAMIN) 1000 MCG tablet Take 1,000 mcg by mouth daily.    [provider]  Vitamin D, Ergocalciferol, (DRISDOL) 1.25 MG (50000 UT) CAPS capsule Take 50,000 Units by mouth every 7 (seven) days.    [provider]    Allergies Augmentin [amoxicillin-pot clavulanate] and Cinnamon  Family History  Problem Relation Age of Onset   Breast cancer Maternal Aunt 13   Breast cancer Paternal Aunt 36    Social History Social History   Tobacco Use   Smoking status: Former Smoker    Quit date: 07/15/2018    Years since quitting: 1.0   Smokeless tobacco: Never Used  Substance Use Topics   Alcohol use: Never    Frequency: Never   Drug use: Never      Review of Systems Constitutional: No fever/chills Eyes: No visual changes. ENT: No sore throat. Cardiovascular: Denies chest pain. Respiratory: Denies shortness of breath. Gastrointestinal: No abdominal pain.  No nausea, no vomiting.  No diarrhea.  No constipation. Genitourinary: Negative for dysuria. Musculoskeletal: Negative for back pain.  Right leg pain. Skin: Negative for rash. Neurological: Negative for headaches, focal weakness or numbness. All other ROS negative ____________________________________________   PHYSICAL EXAM:  VITAL SIGNS: ED Triage Vitals  Enc Vitals Group     BP 07/31/19 1722 (!) 158/99     Pulse Rate 07/31/19 1722 (!) 102     Resp 07/31/19 1722 15     Temp 07/31/19 1722 98.5 F (36.9 C)     Temp Source 07/31/19 1722 Oral     SpO2 07/31/19 1722 95 %     Weight 07/31/19 1723 210 lb (95.3 kg)     Height 07/31/19 1723 5\' 6"  (1.676 m)     Head Circumference --      Peak Flow --      Pain Score 07/31/19 1722 3     Pain Loc --      Pain Edu? --      Excl. in GC? --     Constitutional: Alert and  oriented. Well appearing and in no acute distress. Eyes: Conjunctivae are normal. EOMI. Head: Atraumatic. Nose: No congestion/rhinnorhea. Mouth/Throat: Mucous membranes are moist.   Neck: No stridor. Trachea Midline. FROM Cardiovascular: Normal rate, regular rhythm. Grossly normal heart sounds.  Good peripheral circulation. Respiratory: Normal respiratory effort.  No retractions. Lungs CTAB. Gastrointestinal: Soft and nontender. No distention. No abdominal bruits.  Musculoskeletal: Right leg is held in flexion at the knee.  No obvious deformity.  No redness or erythema over the joint.  2+ distal pulse. Neurologic:  Normal speech and language. No  gross focal neurologic deficits are appreciated.  Skin:  Skin is warm, dry and intact. No rash noted. Psychiatric: Mood and affect are normal. Speech and behavior are normal. GU: Deferred   ____________________________________________   LABS (all labs ordered are listed, but only abnormal results are displayed)  Labs Reviewed  CBC WITH DIFFERENTIAL/PLATELET - Abnormal; Notable for the following components:      Result Value   WBC 13.1 (*)    RBC 5.72 (*)    Hemoglobin 16.3 (*)    HCT 49.7 (*)    Neutro Abs 11.7 (*)    Abs Immature Granulocytes 0.12 (*)    All other components within normal limits  BASIC METABOLIC PANEL - Abnormal; Notable for the following components:   Glucose, Bld 206 (*)    BUN 21 (*)    All other components within normal limits   ____________________________________________   RADIOLOGY Vela ProseI, Filippo Puls E Saniah Schroeter, personally viewed and evaluated these images (plain radiographs) as part of my medical decision making, as well as reviewing the written report by the radiologist.  ED MD interpretation: X-ray no fracture  Official radiology report(s): Ct Angio Low Extrem Right W &/or Wo Contrast  Result Date: 07/31/2019 CLINICAL DATA:  Knee trauma dislocation suspected, rule out arterial injury EXAM: CT angiography of the  right lower extremity TECHNIQUE: Multidetector CT imaging of the right lower extremity was performed following the standard protocol during bolus administration of intravenous contrast. CONTRAST:  125mL OMNIPAQUE IOHEXOL 350 MG/ML SOLN COMPARISON:  Knee radiographs 07/31/2019 FINDINGS: Arterial: The included portions of the inflow vessels are free of acute abnormality. No evidence of aneurysm, dissection or vasculitis. Normal branching of the profundus femoris with normal opacification of the vessel. Normal appearance of the superficial femoral artery and popliteal artery. No direct vascular injury, aneurysm, dissection or features of vasculitis. Normal bifurcation of the anterior tibial and tibioperoneal trunks. The bifurcation of the posterior tibial and peroneal arteries is normal. Separate acquisition of the lower leg was performed demonstrating normal 3 vessel runoff to the level of the ankle there is slight attenuation of peroneal artery below the level of the ankle likely reflecting suboptimal contrast timing. Normal opacification of the pedal arch. Bones/Joint/Cartilage: The patella is normally located. Knee is normally aligned. There is faint lucent nondisplaced fracture involving the medial patella near the insertion of the medial retinaculum. The trochlear groove is somewhat shallow. Some associated thickening and trace stranding is noted in the adjacent soft tissues. Trace effusion is present. No other acute fracture or traumatic malalignment of the knee. Mild tricompartmental degenerative changes are present in the knee including some articular cartilage calcification in the mediolateral femoral condyles. Alignment of the hip is maintained. Mild arthrosis of the right hip with some periacetabular spurring. Ligaments: Suboptimally assessed by CT. Mild thickening of the medial retinaculum. Muscles and Tendons: No acute ligamentous injury or disruption. Minimal thickening the distal vastus medialis as it  courses in close proximity to the medial patella. No other acute musculotendinous injury. Soft tissues: Mild circumferential soft tissue swelling of the knee. Minimal stranding anteriorly. IMPRESSION: 1. No evidence of direct vascular injury, aneurysm, dissection, or features of vasculitis. Normal 3 vessel runoff. 2. Subtle lucent nondisplaced fracture involving the medial patella near the insertion of the medial retinaculum. The trochlear groove is somewhat shallow which may correlate with patient's history of recurrent patellar dislocation. Some associated thickening of the medial retinaculum and trace stranding in the adjacent soft tissues. 3. Mild tricompartmental degenerative changes of the right  knee. These results were called by telephone at the time of interpretation on 07/31/2019 at 7:40 pm to provider [facility] , who verbally acknowledged these results. Electronically Signed   By: Lovena Le M.D.   On: 07/31/2019 19:40   Dg Knee Complete 4 Views Right  Result Date: 07/31/2019 CLINICAL DATA:  Right knee pain EXAM: RIGHT KNEE - COMPLETE 4+ VIEW COMPARISON:  None. FINDINGS: No fracture or malalignment. Mild degenerative changes. No large knee effusion IMPRESSION: No acute osseous abnormality Electronically Signed   By: Donavan Foil M.D.   On: 07/31/2019 17:49    ____________________________________________   PROCEDURES  Procedure(s) performed (including Critical Care):  .Sedation  Date/Time: 07/31/2019 11:43 PM Performed by: Vanessa [place], MD Authorized by: Vanessa [place], MD   Consent:    Consent obtained:  Verbal   Consent given by:  Patient   Risks discussed:  Allergic reaction, dysrhythmia, inadequate sedation, nausea, prolonged hypoxia resulting in organ damage, prolonged sedation necessitating reversal, respiratory compromise necessitating ventilatory assistance and intubation and vomiting   Alternatives discussed:  Analgesia without sedation, anxiolysis and regional  anesthesia Universal protocol:    Procedure explained and questions answered to patient or proxy's satisfaction: yes     Relevant documents present and verified: yes     Test results available and properly labeled: yes     Imaging studies available: yes     Required blood products, implants, devices, and special equipment available: yes     Site/side marked: yes     Immediately prior to procedure a time out was called: yes     Patient identity confirmation method:  Verbally with patient Indications:    Procedure necessitating sedation performed by:  Physician performing sedation Pre-sedation assessment:    Time since last food or drink:  6 hours   ASA classification: class 1 - normal, healthy patient     Neck mobility: normal     Mouth opening:  3 or more finger widths   Thyromental distance:  4 finger widths   Mallampati score:  I - soft palate, uvula, fauces, pillars visible   Pre-sedation assessments completed and reviewed: airway patency, cardiovascular function, hydration status, mental status, nausea/vomiting, pain level, respiratory function and temperature   Immediate pre-procedure details:    Reassessment: Patient reassessed immediately prior to procedure     Reviewed: vital signs, relevant labs/tests and NPO status     Verified: bag valve mask available, emergency equipment available, intubation equipment available, IV patency confirmed, oxygen available and suction available   Procedure details (see MAR for exact dosages):    Preoxygenation:  Nasal cannula   Sedation:  Ketamine   Intended level of sedation: deep   Intra-procedure monitoring:  Blood pressure monitoring, cardiac monitor, continuous pulse oximetry, frequent LOC assessments, frequent vital sign checks and continuous capnometry   Intra-procedure events: none     Total Provider sedation time (minutes):  10 Post-procedure details:    Attendance: Constant attendance by certified staff until patient recovered      Recovery: Patient returned to pre-procedure baseline     Post-sedation assessments completed and reviewed: airway patency, cardiovascular function, hydration status, mental status, nausea/vomiting, pain level, respiratory function and temperature     Patient is stable for discharge or admission: yes     Patient tolerance:  Tolerated well, no immediate complications Reduction of dislocation  Date/Time: 07/31/2019 11:43 PM Performed by: Vanessa [place], MD Authorized by: Vanessa [place], MD  Consent: Verbal consent obtained. Risks and  benefits: risks, benefits and alternatives were discussed Time out: Immediately prior to procedure a "time out" was called to verify the correct patient, procedure, equipment, support staff and site/side marked as required.  Sedation: Patient sedated: yes Sedatives: ketamine Vitals: Vital signs were monitored during sedation.  Patient tolerance: patient tolerated the procedure well with no immediate complications      ____________________________________________   INITIAL IMPRESSION / ASSESSMENT AND PLAN / ED COURSE  Michaela Caldwell was evaluated in Emergency Department on 07/31/2019 for the symptoms described in the history of present illness. She was evaluated in the context of the global COVID-19 pandemic, which necessitated consideration that the patient might be at risk for infection with the SARS-CoV-2 virus that causes COVID-19. Institutional protocols and algorithms that pertain to the evaluation of patients at risk for COVID-19 are in a state of rapid change based on information released by regulatory bodies including the CDC and federal and state organizations. These policies and algorithms were followed during the patient's care in the ED.    Patient presents with right knee injury after moving and.  She not actually fall on the ground for lower suspicion for fracture.  Sounds ago possible dislocation of her patella versus something of ligament  dislocation given she is had prior the surgeries in the past.  No redness or erythema over the joint to suggest septic joint.  No other injuries.   CTA was negative for fractures.  Angio was normal.   Sedated patient with etomidate and reduced her knee.  I did hear a large thump.  Patient has good distal pulses after reduction.  Patient was placed in a knee immobilizer.  Patient able to ambulate and will be discharged home with follow-up with orthopedics.      ____________________________________________   FINAL CLINICAL IMPRESSION(S) / ED DIAGNOSES   Final diagnoses:  Dislocation of right knee, initial encounter      MEDICATIONS GIVEN DURING THIS VISIT:  Medications  HYDROmorphone (DILAUDID) injection 0.5 mg (0.5 mg Intravenous Given 07/31/19 1831)  iohexol (OMNIPAQUE) 350 MG/ML injection 125 mL (125 mLs Intravenous Contrast Given 07/31/19 1837)  HYDROmorphone (DILAUDID) injection 0.5 mg (0.5 mg Intravenous Given 07/31/19 1912)  etomidate (AMIDATE) injection 10 mg (10 mg Intravenous Given 07/31/19 2328)     ED Discharge Orders    None       Note:  This document was prepared using Dragon voice recognition software and may include unintentional dictation errors.   Concha Se, MD 07/31/19 [phone]

## 2019-07-31 NOTE — ED Triage Notes (Signed)
Pt arrival via [facility] from home due to knee pain. Pt received epidural on her left side this morning for herniated discs. Pt went home and was resting and then when moving from the couch to the floor pt's knee went out.  Pt reports history of knee dislocation but states that the last time it went out of joint was 10 years ago and that she had surgery for it.   Pt received 100 fentynl with EMS. EMS VS- 160/80, 100 HR, 97% RA.

## 2019-07-31 NOTE — Discharge Instructions (Addendum)
Call the orthopedic surgeon tomorrow to get schedule appointment for next week for evaluation discussed MRI.  1. No evidence of direct vascular injury, aneurysm, dissection, or features of vasculitis. Normal 3 vessel runoff. 2. Subtle lucent nondisplaced fracture involving the medial patella near the insertion of the medial retinaculum. The trochlear groove is somewhat shallow which may correlate with patient's history of recurrent patellar dislocation. Some associated thickening of the medial retinaculum and trace stranding in the adjacent soft tissues. 3. Mild tricompartmental degenerative changes of the right knee.  Return to the ER for coolness in your foot numbness or any other concerns.

## 2019-08-05 ENCOUNTER — Other Ambulatory Visit: Payer: Self-pay | Admitting: Neurosurgery

## 2019-08-05 DIAGNOSIS — R200 Anesthesia of skin: Secondary | ICD-10-CM

## 2019-08-20 ENCOUNTER — Ambulatory Visit
Admission: RE | Admit: 2019-08-20 | Discharge: 2019-08-20 | Disposition: A | Discharge status: Home / Self Care | Payer: 59 | Source: Ambulatory Visit | Attending: Neurosurgery | Admitting: Neurosurgery

## 2019-08-20 ENCOUNTER — Other Ambulatory Visit: Payer: Self-pay

## 2019-08-20 DIAGNOSIS — R200 Anesthesia of skin: Secondary | ICD-10-CM | POA: Insufficient documentation

## 2019-09-02 ENCOUNTER — Other Ambulatory Visit: Payer: Self-pay | Admitting: Neurosurgery

## 2019-09-09 ENCOUNTER — Other Ambulatory Visit
Admission: RE | Admit: 2019-09-09 | Discharge: 2019-09-09 | Disposition: A | Discharge status: Home / Self Care | Payer: 59 | Source: Ambulatory Visit | Attending: Neurosurgery | Admitting: Neurosurgery

## 2019-09-09 NOTE — Pre-Procedure Instructions (Signed)
Attempted to reach patient via phone for a preop interview.  12:oo pm left message on voicemail to return call.  Attempted again at 4:45pm and left a voice message again to call preadmit testing.

## 2019-09-10 ENCOUNTER — Encounter
Admission: RE | Admit: 2019-09-10 | Discharge: 2019-09-10 | Disposition: A | Discharge status: Home / Self Care | Payer: 59 | Source: Ambulatory Visit | Attending: Neurosurgery | Admitting: Neurosurgery

## 2019-09-10 ENCOUNTER — Other Ambulatory Visit: Payer: Self-pay

## 2019-09-10 DIAGNOSIS — M5416 Radiculopathy, lumbar region: Secondary | ICD-10-CM | POA: Diagnosis not present

## 2019-09-10 DIAGNOSIS — Z01812 Encounter for preprocedural laboratory examination: Secondary | ICD-10-CM | POA: Diagnosis not present

## 2019-09-10 DIAGNOSIS — Z20828 Contact with and (suspected) exposure to other viral communicable diseases: Secondary | ICD-10-CM | POA: Diagnosis not present

## 2019-09-10 NOTE — Patient Instructions (Signed)
Your procedure is scheduled on: Monday 09/15/19 Report to [place]. To find out your arrival time please call [phone] between 1PM - 3PM on Thursday 09/11/19.  Remember: Instructions that are not followed completely may result in serious medical risk, up to and including death, or upon the discretion of your surgeon and anesthesiologist your surgery may need to be rescheduled.     _X__ 1. Do not eat food after midnight the night before your procedure.                 No gum chewing or hard candies. You may drink clear liquids up to 2 hours                 before you are scheduled to arrive for your surgery- DO not drink clear                 liquids within 2 hours of the start of your surgery.                 Clear Liquids include:  water, apple juice without pulp, clear carbohydrate                 drink such as Clearfast or Gatorade, Black Coffee or Tea (Do not add                 anything to coffee or tea). Diabetics water only  __X__2.  On the morning of surgery brush your teeth with toothpaste and water, you                 may rinse your mouth with mouthwash if you wish.  Do not swallow any              toothpaste of mouthwash.     _X__ 3.  No Alcohol for 24 hours before or after surgery.   _X__ 4.  Do Not Smoke or use e-cigarettes For 24 Hours Prior to Your Surgery.                 Do not use any chewable tobacco products for at least 6 hours prior to                 surgery.  ____  5.  Bring all medications with you on the day of surgery if instructed.   __X__  6.  Notify your doctor if there is any change in your medical condition      (cold, fever, infections).     Do not wear jewelry, make-up, hairpins, clips or nail polish. Do not wear lotions, powders, or perfumes.  Do not shave 48 hours prior to surgery. Men may shave face and neck. Do not bring valuables to the hospital.    [facility] is not responsible for  any belongings or valuables.  Contacts, dentures/partials or body piercings may not be worn into surgery. Bring a case for your contacts, glasses or hearing aids, a denture cup will be supplied. Leave your suitcase in the car. After surgery it may be brought to your room. For patients admitted to the hospital, discharge time is determined by your treatment team.   Patients discharged the day of surgery will not be allowed to drive home.   Please read over the following fact sheets that you were given:   MRSA Information  __X__ Take these medicines the morning of surgery with A SIP OF WATER:  1. Gabapentin  2. Levothyroxine  3. Omeprazole  4.  5.  6.  ____ Fleet Enema (as directed)   __X__ Use CHG Soap/SAGE wipes as directed  ____ Use inhalers on the day of surgery  ____ Stop metformin/Janumet/Farxiga 2 days prior to surgery    ____ Take 1/2 of usual insulin dose the night before surgery. No insulin the morning          of surgery.   ____ Stop Blood Thinners Coumadin/Plavix/Xarelto/Pleta/Pradaxa/Eliquis/Effient/Aspirin  on   Or contact your Surgeon, Cardiologist or Medical Doctor regarding  ability to stop your blood thinners  __X__ Stop Anti-inflammatories 7 days before surgery such as Advil, Ibuprofen, Motrin,  BC or Goodies Powder, Naprosyn, Naproxen, Aleve, (Aspirin and Diclofenac)   __X__ Stop all herbal supplements, fish oil or vitamin E until after surgery. (Turmeric)   ____ Bring C-Pap to the hospital.

## 2019-09-11 ENCOUNTER — Other Ambulatory Visit: Payer: 59

## 2019-09-11 ENCOUNTER — Other Ambulatory Visit: Admission: RE | Admit: 2019-09-11 | Payer: 59 | Source: Ambulatory Visit

## 2019-09-11 ENCOUNTER — Encounter
Admission: RE | Admit: 2019-09-11 | Discharge: 2019-09-11 | Disposition: A | Discharge status: Home / Self Care | Payer: 59 | Source: Ambulatory Visit | Attending: Neurosurgery | Admitting: Neurosurgery

## 2019-09-11 DIAGNOSIS — Z01812 Encounter for preprocedural laboratory examination: Secondary | ICD-10-CM | POA: Diagnosis not present

## 2019-09-11 LAB — CBC
HCT: 47.6 % — ABNORMAL HIGH (ref 36.0–46.0)
Hemoglobin: 15.3 g/dL — ABNORMAL HIGH (ref 12.0–15.0)
MCH: 28.1 pg (ref 26.0–34.0)
MCHC: 32.1 g/dL (ref 30.0–36.0)
MCV: 87.3 fL (ref 80.0–100.0)
Platelets: 276 10*3/uL (ref 150–400)
RBC: 5.45 MIL/uL — ABNORMAL HIGH (ref 3.87–5.11)
RDW: 13.3 % (ref 11.5–15.5)
WBC: 11.6 10*3/uL — ABNORMAL HIGH (ref 4.0–10.5)
nRBC: 0 % (ref 0.0–0.2)

## 2019-09-11 LAB — URINALYSIS, ROUTINE W REFLEX MICROSCOPIC
Bilirubin Urine: NEGATIVE
Glucose, UA: NEGATIVE mg/dL
Hgb urine dipstick: NEGATIVE
Ketones, ur: NEGATIVE mg/dL
Leukocytes,Ua: NEGATIVE
Nitrite: NEGATIVE
Protein, ur: NEGATIVE mg/dL
Specific Gravity, Urine: 1.012 (ref 1.005–1.030)
pH: 6 (ref 5.0–8.0)

## 2019-09-11 LAB — APTT: aPTT: 27 seconds (ref 24–36)

## 2019-09-11 LAB — SURGICAL PCR SCREEN
MRSA, PCR: NEGATIVE
Staphylococcus aureus: POSITIVE — AB

## 2019-09-11 LAB — BASIC METABOLIC PANEL
Anion gap: 10 (ref 5–15)
BUN: 14 mg/dL (ref 6–20)
CO2: 27 mmol/L (ref 22–32)
Calcium: 9.6 mg/dL (ref 8.9–10.3)
Chloride: 104 mmol/L (ref 98–111)
Creatinine, Ser: 0.78 mg/dL (ref 0.44–1.00)
GFR calc Af Amer: >60 mL/min (ref >60)
GFR calc non Af Amer: >60 mL/min (ref >60)
Glucose, Bld: 136 mg/dL — ABNORMAL HIGH (ref 70–99)
Potassium: 4.3 mmol/L (ref 3.5–5.1)
Sodium: 141 mmol/L (ref 135–145)

## 2019-09-11 LAB — PROTIME-INR
INR: 0.9 (ref 0.8–1.2)
Prothrombin Time: 11.9 seconds (ref 11.4–15.2)

## 2019-09-11 LAB — TYPE AND SCREEN
ABO/RH(D): O POS
Antibody Screen: NEGATIVE

## 2019-09-11 LAB — SARS CORONAVIRUS 2 (TAT 6-24 HRS): SARS Coronavirus 2: NEGATIVE

## 2019-09-15 ENCOUNTER — Ambulatory Visit: Payer: 59

## 2019-09-15 ENCOUNTER — Ambulatory Visit: Payer: 59 | Admitting: Anesthesiology

## 2019-09-15 ENCOUNTER — Encounter
Admission: RE | Disposition: A | Discharge status: Home / Self Care | Payer: Self-pay | Source: Home / Self Care | Attending: Neurosurgery

## 2019-09-15 ENCOUNTER — Other Ambulatory Visit: Payer: Self-pay

## 2019-09-15 ENCOUNTER — Ambulatory Visit
Admission: RE | Admit: 2019-09-15 | Discharge: 2019-09-15 | Disposition: A | Discharge status: Home / Self Care | Payer: 59 | Attending: Neurosurgery | Admitting: Neurosurgery

## 2019-09-15 ENCOUNTER — Encounter: Payer: Self-pay | Admitting: Neurosurgery

## 2019-09-15 DIAGNOSIS — M1990 Unspecified osteoarthritis, unspecified site: Secondary | ICD-10-CM | POA: Insufficient documentation

## 2019-09-15 DIAGNOSIS — Z880 Allergy status to penicillin: Secondary | ICD-10-CM | POA: Insufficient documentation

## 2019-09-15 DIAGNOSIS — M48061 Spinal stenosis, lumbar region without neurogenic claudication: Secondary | ICD-10-CM | POA: Diagnosis not present

## 2019-09-15 DIAGNOSIS — Z791 Long term (current) use of non-steroidal anti-inflammatories (NSAID): Secondary | ICD-10-CM | POA: Insufficient documentation

## 2019-09-15 DIAGNOSIS — Z881 Allergy status to other antibiotic agents status: Secondary | ICD-10-CM | POA: Diagnosis not present

## 2019-09-15 DIAGNOSIS — R0683 Snoring: Secondary | ICD-10-CM | POA: Insufficient documentation

## 2019-09-15 DIAGNOSIS — Z79899 Other long term (current) drug therapy: Secondary | ICD-10-CM | POA: Insufficient documentation

## 2019-09-15 DIAGNOSIS — Z90721 Acquired absence of ovaries, unilateral: Secondary | ICD-10-CM | POA: Diagnosis not present

## 2019-09-15 DIAGNOSIS — Z419 Encounter for procedure for purposes other than remedying health state, unspecified: Secondary | ICD-10-CM

## 2019-09-15 DIAGNOSIS — Z87891 Personal history of nicotine dependence: Secondary | ICD-10-CM | POA: Diagnosis not present

## 2019-09-15 DIAGNOSIS — E039 Hypothyroidism, unspecified: Secondary | ICD-10-CM | POA: Diagnosis not present

## 2019-09-15 DIAGNOSIS — Z91018 Allergy to other foods: Secondary | ICD-10-CM | POA: Insufficient documentation

## 2019-09-15 DIAGNOSIS — E669 Obesity, unspecified: Secondary | ICD-10-CM | POA: Insufficient documentation

## 2019-09-15 DIAGNOSIS — E119 Type 2 diabetes mellitus without complications: Secondary | ICD-10-CM | POA: Insufficient documentation

## 2019-09-15 DIAGNOSIS — G4700 Insomnia, unspecified: Secondary | ICD-10-CM | POA: Diagnosis not present

## 2019-09-15 DIAGNOSIS — Z7982 Long term (current) use of aspirin: Secondary | ICD-10-CM | POA: Insufficient documentation

## 2019-09-15 DIAGNOSIS — M5416 Radiculopathy, lumbar region: Secondary | ICD-10-CM | POA: Insufficient documentation

## 2019-09-15 DIAGNOSIS — F419 Anxiety disorder, unspecified: Secondary | ICD-10-CM | POA: Diagnosis not present

## 2019-09-15 DIAGNOSIS — Z6833 Body mass index (BMI) 33.0-33.9, adult: Secondary | ICD-10-CM | POA: Insufficient documentation

## 2019-09-15 DIAGNOSIS — K219 Gastro-esophageal reflux disease without esophagitis: Secondary | ICD-10-CM | POA: Insufficient documentation

## 2019-09-15 DIAGNOSIS — F329 Major depressive disorder, single episode, unspecified: Secondary | ICD-10-CM | POA: Insufficient documentation

## 2019-09-15 DIAGNOSIS — M797 Fibromyalgia: Secondary | ICD-10-CM | POA: Insufficient documentation

## 2019-09-15 DIAGNOSIS — Z90710 Acquired absence of both cervix and uterus: Secondary | ICD-10-CM | POA: Diagnosis not present

## 2019-09-15 DIAGNOSIS — R519 Headache, unspecified: Secondary | ICD-10-CM | POA: Insufficient documentation

## 2019-09-15 DIAGNOSIS — Z803 Family history of malignant neoplasm of breast: Secondary | ICD-10-CM | POA: Diagnosis not present

## 2019-09-15 DIAGNOSIS — E785 Hyperlipidemia, unspecified: Secondary | ICD-10-CM | POA: Diagnosis not present

## 2019-09-15 HISTORY — PX: HEMI-MICRODISCECTOMY LUMBAR LAMINECTOMY LEVEL 1: SHX5846

## 2019-09-15 LAB — GLUCOSE, CAPILLARY
Glucose-Capillary: 122 mg/dL — ABNORMAL HIGH (ref 70–99)
Glucose-Capillary: 139 mg/dL — ABNORMAL HIGH (ref 70–99)

## 2019-09-15 SURGERY — HEMI-MICRODISCECTOMY LUMBAR LAMINECTOMY LEVEL 1
Anesthesia: General | Laterality: Left

## 2019-09-15 MED ORDER — FENTANYL CITRATE (PF) 100 MCG/2ML IJ SOLN
INTRAMUSCULAR | Status: AC
  Administered 2019-09-15: 12:00:00 50 ug via INTRAVENOUS
  Filled 2019-09-15: qty 2

## 2019-09-15 MED ORDER — HYDROCODONE-ACETAMINOPHEN 7.5-325 MG PO TABS: 1.0000 | ORAL_TABLET | Freq: Once | ORAL | Status: AC | PRN

## 2019-09-15 MED ORDER — MIDAZOLAM HCL 2 MG/2ML IJ SOLN
INTRAMUSCULAR | Status: AC
  Filled 2019-09-15: qty 2

## 2019-09-15 MED ORDER — PROPOFOL 10 MG/ML IV BOLUS
INTRAVENOUS | Status: AC
  Filled 2019-09-15: qty 40

## 2019-09-15 MED ORDER — EPHEDRINE SULFATE 50 MG/ML IJ SOLN
INTRAMUSCULAR | Status: DC | PRN
  Administered 2019-09-15: 10 mg via INTRAVENOUS
  Administered 2019-09-15: 5 mg via INTRAVENOUS
  Administered 2019-09-15: 10 mg via INTRAVENOUS

## 2019-09-15 MED ORDER — LIDOCAINE HCL (PF) 1 % IJ SOLN
INTRAMUSCULAR | Status: AC
  Filled 2019-09-15: qty 30

## 2019-09-15 MED ORDER — HYDROMORPHONE HCL 1 MG/ML IJ SOLN
INTRAMUSCULAR | Status: DC | PRN
  Administered 2019-09-15 (×2): .5 mg via INTRAVENOUS

## 2019-09-15 MED ORDER — SUCCINYLCHOLINE CHLORIDE 20 MG/ML IJ SOLN
INTRAMUSCULAR | Status: DC | PRN
  Administered 2019-09-15: 100 mg via INTRAVENOUS

## 2019-09-15 MED ORDER — OXYCODONE HCL 5 MG PO TABS: 5.0000 mg | ORAL_TABLET | ORAL | 0 refills | Status: DC | PRN

## 2019-09-15 MED ORDER — CEFAZOLIN SODIUM-DEXTROSE 2-4 GM/100ML-% IV SOLN
INTRAVENOUS | Status: AC
  Filled 2019-09-15: qty 100

## 2019-09-15 MED ORDER — HYDROMORPHONE HCL 1 MG/ML IJ SOLN
INTRAMUSCULAR | Status: AC
  Filled 2019-09-15: qty 1

## 2019-09-15 MED ORDER — CEFAZOLIN SODIUM-DEXTROSE 2-4 GM/100ML-% IV SOLN
2.0000 g | Freq: Once | INTRAVENOUS | Status: AC
  Administered 2019-09-15: 2 g via INTRAVENOUS

## 2019-09-15 MED ORDER — SODIUM CHLORIDE 0.9 % IV SOLN: INTRAVENOUS | Status: DC

## 2019-09-15 MED ORDER — PHENYLEPHRINE HCL (PRESSORS) 10 MG/ML IV SOLN
INTRAVENOUS | Status: DC | PRN
  Administered 2019-09-15: 100 ug via INTRAVENOUS

## 2019-09-15 MED ORDER — DEXAMETHASONE SODIUM PHOSPHATE 10 MG/ML IJ SOLN
INTRAMUSCULAR | Status: DC | PRN
  Administered 2019-09-15: 10 mg via INTRAVENOUS

## 2019-09-15 MED ORDER — PROPOFOL 10 MG/ML IV BOLUS
INTRAVENOUS | Status: DC | PRN
  Administered 2019-09-15: 150 mg via INTRAVENOUS

## 2019-09-15 MED ORDER — HYDROMORPHONE HCL 1 MG/ML IJ SOLN
INTRAMUSCULAR | Status: AC
  Administered 2019-09-15: 0.5 mg via INTRAVENOUS
  Filled 2019-09-15: qty 1

## 2019-09-15 MED ORDER — LACTATED RINGERS IV SOLN: INTRAVENOUS | Status: DC | PRN

## 2019-09-15 MED ORDER — FENTANYL CITRATE (PF) 100 MCG/2ML IJ SOLN
INTRAMUSCULAR | Status: DC | PRN
  Administered 2019-09-15: 100 ug via INTRAVENOUS

## 2019-09-15 MED ORDER — LIDOCAINE-EPINEPHRINE (PF) 1 %-1:200000 IJ SOLN
INTRAMUSCULAR | Status: DC | PRN
  Administered 2019-09-15: 10 mL

## 2019-09-15 MED ORDER — FENTANYL CITRATE (PF) 100 MCG/2ML IJ SOLN
25.0000 ug | INTRAMUSCULAR | Status: DC | PRN
  Administered 2019-09-15: 50 ug via INTRAVENOUS

## 2019-09-15 MED ORDER — THROMBIN 5000 UNITS EX SOLR
CUTANEOUS | Status: AC
  Filled 2019-09-15: qty 5000

## 2019-09-15 MED ORDER — METHYLPREDNISOLONE ACETATE 40 MG/ML IJ SUSP
INTRAMUSCULAR | Status: DC | PRN
  Administered 2019-09-15: 40 mg via INTRA_ARTICULAR

## 2019-09-15 MED ORDER — EPINEPHRINE PF 1 MG/ML IJ SOLN
INTRAMUSCULAR | Status: AC
  Filled 2019-09-15: qty 1

## 2019-09-15 MED ORDER — HYDROMORPHONE HCL 1 MG/ML IJ SOLN
0.5000 mg | INTRAMUSCULAR | Status: DC | PRN
  Administered 2019-09-15: 0.5 mg via INTRAVENOUS

## 2019-09-15 MED ORDER — THROMBIN 5000 UNITS EX SOLR
CUTANEOUS | Status: DC | PRN
  Administered 2019-09-15: 5000 [IU] via TOPICAL

## 2019-09-15 MED ORDER — ACETAMINOPHEN 10 MG/ML IV SOLN: 1000.0000 mg | Freq: Once | INTRAVENOUS | Status: DC | PRN

## 2019-09-15 MED ORDER — KETOROLAC TROMETHAMINE 30 MG/ML IJ SOLN
INTRAMUSCULAR | Status: AC
  Filled 2019-09-15: qty 1

## 2019-09-15 MED ORDER — MIDAZOLAM HCL 2 MG/2ML IJ SOLN
INTRAMUSCULAR | Status: DC | PRN
  Administered 2019-09-15: 2 mg via INTRAVENOUS

## 2019-09-15 MED ORDER — PHENYLEPHRINE HCL-NACL 10-0.9 MG/250ML-% IV SOLN
INTRAVENOUS | Status: DC | PRN
  Administered 2019-09-15: 30 ug/min via INTRAVENOUS

## 2019-09-15 MED ORDER — FENTANYL CITRATE (PF) 100 MCG/2ML IJ SOLN
INTRAMUSCULAR | Status: AC
  Filled 2019-09-15: qty 2

## 2019-09-15 MED ORDER — ONDANSETRON HCL 4 MG/2ML IJ SOLN: 4.0000 mg | Freq: Once | INTRAMUSCULAR | Status: DC | PRN

## 2019-09-15 MED ORDER — ONDANSETRON HCL 4 MG/2ML IJ SOLN
INTRAMUSCULAR | Status: DC | PRN
  Administered 2019-09-15: 4 mg via INTRAVENOUS

## 2019-09-15 MED ORDER — KETOROLAC TROMETHAMINE 30 MG/ML IJ SOLN
INTRAMUSCULAR | Status: DC | PRN
  Administered 2019-09-15: 30 mg via INTRAVENOUS

## 2019-09-15 MED ORDER — BUPIVACAINE HCL (PF) 0.5 % IJ SOLN
INTRAMUSCULAR | Status: AC
  Filled 2019-09-15: qty 30

## 2019-09-15 MED ORDER — HYDROCODONE-ACETAMINOPHEN 7.5-325 MG PO TABS
ORAL_TABLET | ORAL | Status: AC
  Administered 2019-09-15: 1 via ORAL
  Filled 2019-09-15: qty 1

## 2019-09-15 MED ORDER — LIDOCAINE HCL (CARDIAC) PF 100 MG/5ML IV SOSY
PREFILLED_SYRINGE | INTRAVENOUS | Status: DC | PRN
  Administered 2019-09-15: 100 mg via INTRAVENOUS

## 2019-09-15 MED ORDER — CYCLOBENZAPRINE HCL 5 MG PO TABS: 5.0000 mg | ORAL_TABLET | Freq: Three times a day (TID) | ORAL | 0 refills | Status: AC | PRN

## 2019-09-15 SURGICAL SUPPLY — 58 items
BUR NEURO DRILL SOFT 3.0X3.8M (BURR) ×2 IMPLANT
CANISTER SUCT 1200ML W/VALVE (MISCELLANEOUS) ×4 IMPLANT
CHLORAPREP W/TINT 26 (MISCELLANEOUS) ×4 IMPLANT
COUNTER NEEDLE 20/40 LG (NEEDLE) ×2 IMPLANT
COVER LIGHT HANDLE STERIS (MISCELLANEOUS) ×4 IMPLANT
COVER WAND RF STERILE (DRAPES) ×2 IMPLANT
CUP MEDICINE 2OZ PLAST GRAD ST (MISCELLANEOUS) ×2 IMPLANT
DERMABOND ADVANCED (GAUZE/BANDAGES/DRESSINGS) ×1
DERMABOND ADVANCED .7 DNX12 (GAUZE/BANDAGES/DRESSINGS) ×1 IMPLANT
DRAPE C-ARM 42X72 X-RAY (DRAPES) ×4 IMPLANT
DRAPE LAPAROTOMY 100X77 ABD (DRAPES) ×2 IMPLANT
DRAPE MICROSCOPE SPINE 48X150 (DRAPES) IMPLANT
DRAPE SURG 17X11 SM STRL (DRAPES) ×2 IMPLANT
DRSG TEGADERM 4X4.75 (GAUZE/BANDAGES/DRESSINGS) ×2 IMPLANT
DRSG TELFA 3X8 NADH (GAUZE/BANDAGES/DRESSINGS) ×2 IMPLANT
DRSG TELFA 4X3 1S NADH ST (GAUZE/BANDAGES/DRESSINGS) IMPLANT
DURASEAL APPLICATOR TIP (TIP) IMPLANT
DURASEAL SPINE SEALANT 3ML (MISCELLANEOUS) IMPLANT
ELECT CAUTERY BLADE TIP 2.5 (TIP) ×2
ELECT EZSTD 165MM 6.5IN (MISCELLANEOUS) ×2
ELECT REM PT RETURN 9FT ADLT (ELECTROSURGICAL) ×2
ELECTRODE CAUTERY BLDE TIP 2.5 (TIP) ×1 IMPLANT
ELECTRODE EZSTD 165MM 6.5IN (MISCELLANEOUS) ×1 IMPLANT
ELECTRODE REM PT RTRN 9FT ADLT (ELECTROSURGICAL) ×1 IMPLANT
GAUZE SPONGE 4X4 12PLY STRL (GAUZE/BANDAGES/DRESSINGS) ×2 IMPLANT
GLOVE BIOGEL PI IND STRL 7.0 (GLOVE) ×1 IMPLANT
GLOVE BIOGEL PI INDICATOR 7.0 (GLOVE) ×1
GLOVE INDICATOR 8.0 STRL GRN (GLOVE) ×6 IMPLANT
GLOVE SURG SYN 7.0 (GLOVE) ×4 IMPLANT
GLOVE SURG SYN 8.0 (GLOVE) ×2 IMPLANT
GOWN STRL REUS W/ TWL XL LVL3 (GOWN DISPOSABLE) ×1 IMPLANT
GOWN STRL REUS W/TWL MED LVL3 (GOWN DISPOSABLE) ×2 IMPLANT
GOWN STRL REUS W/TWL XL LVL3 (GOWN DISPOSABLE) ×1
GRADUATE 1200CC STRL 31836 (MISCELLANEOUS) ×2 IMPLANT
KIT TURNOVER KIT A (KITS) ×2 IMPLANT
KIT WILSON FRAME (KITS) ×2 IMPLANT
KNIFE BAYONET SHORT DISCETOMY (MISCELLANEOUS) IMPLANT
MARKER SKIN DUAL TIP RULER LAB (MISCELLANEOUS) ×4 IMPLANT
NDL SAFETY ECLIPSE 18X1.5 (NEEDLE) ×1 IMPLANT
NEEDLE HYPO 18GX1.5 SHARP (NEEDLE) ×1
NEEDLE HYPO 22GX1.5 SAFETY (NEEDLE) ×2 IMPLANT
NS IRRIG 1000ML POUR BTL (IV SOLUTION) ×2 IMPLANT
PACK LAMINECTOMY NEURO (CUSTOM PROCEDURE TRAY) ×2 IMPLANT
PAD ARMBOARD 7.5X6 YLW CONV (MISCELLANEOUS) ×2 IMPLANT
SPOGE SURGIFLO 8M (HEMOSTASIS) ×1
SPONGE SURGIFLO 8M (HEMOSTASIS) ×1 IMPLANT
STAPLER SKIN PROX 35W (STAPLE) IMPLANT
SUT NURALON 4 0 TR CR/8 (SUTURE) IMPLANT
SUT POLYSORB 2-0 5X18 GS-10 (SUTURE) ×6 IMPLANT
SUT VIC AB 0 CT1 18XCR BRD 8 (SUTURE) ×1 IMPLANT
SUT VIC AB 0 CT1 8-18 (SUTURE) ×1
SYR 10ML LL (SYRINGE) ×4 IMPLANT
SYR 30ML LL (SYRINGE) ×2 IMPLANT
SYR 3ML LL SCALE MARK (SYRINGE) ×2 IMPLANT
TOWEL OR 17X26 4PK STRL BLUE (TOWEL DISPOSABLE) ×8 IMPLANT
TUBE MATRX SPINL 18MM 8CM DISP (INSTRUMENTS) ×2
TUBE METRX SPINAL 18X8 DISP (INSTRUMENTS) ×2 IMPLANT
TUBING CONNECTING 10 (TUBING) ×2 IMPLANT

## 2019-09-15 NOTE — Discharge Instructions (Addendum)
AMBULATORY SURGERY  DISCHARGE INSTRUCTIONS   1) The drugs that you were given will stay in your system until tomorrow so for the next 24 hours you should not:  A) Drive an automobile B) Make any legal decisions C) Drink any alcoholic beverage   2) You may resume regular meals tomorrow.  Today it is better to start with liquids and gradually work up to solid foods.  You may eat anything you prefer, but it is better to start with liquids, then soup and crackers, and gradually work up to solid foods.   3) Please notify your doctor immediately if you have any unusual bleeding, trouble breathing, redness and pain at the surgery site, drainage, fever, or pain not relieved by medication.    4) Additional Instructions:        Please contact your physician with any problems or Same Day Surgery at [phone], Monday through Friday 6 am to 4 pm, or [place] at [facility] number at [phone].AMBULATORY SURGERY  DISCHARGE INSTRUCTIONS   5) The drugs that you were given will stay in your system until tomorrow so for the next 24 hours you should not:  D) Drive an automobile E) Make any legal decisions F) Drink any alcoholic beverage   6) You may resume regular meals tomorrow.  Today it is better to start with liquids and gradually work up to solid foods.  You may eat anything you prefer, but it is better to start with liquids, then soup and crackers, and gradually work up to solid foods.   7) Please notify your doctor immediately if you have any unusual bleeding, trouble breathing, redness and pain at the surgery site, drainage, fever, or pain not relieved by medication.    8) Additional Instructions:        Please contact your physician with any problems or Same Day Surgery at [phone], Monday through Friday 6 am to 4 pm, or [place] at [facility] number at [phone]. Your surgeon has performed an operation on your lumbar spine (low back) to relieve  pressure on one or more nerves. Many times, patients feel better immediately after surgery and can "overdo it." Even if you feel well, it is important that you follow these activity guidelines. If you do not let your back heal properly from the surgery, you can increase the chance of a disc herniation and/or return of your symptoms. The following are instructions to help in your recovery once you have been discharged from the hospital.  * Do not take anti-inflammatory medications for 3 days after surgery (naproxen [Aleve], ibuprofen [Advil, Motrin], celecoxib [Celebrex], etc.)  Activity    No bending, lifting, or twisting ("BLT"). Avoid lifting objects heavier than 10 pounds (gallon milk jug).  Where possible, avoid household activities that involve lifting, bending, pushing, or pulling such as laundry, vacuuming, grocery shopping, and childcare. Try to arrange for help from friends and family for these activities while your back heals.  Increase physical activity slowly as tolerated.  Taking short walks is encouraged, but avoid strenuous exercise. Do not jog, run, bicycle, lift weights, or participate in any other exercises unless specifically allowed by your doctor. Avoid prolonged sitting, including car rides.  Talk to your doctor before resuming sexual activity.  You should not drive until cleared by your doctor.  Until released by your doctor, you should not return to work or school.  You should rest at home and let your body heal.   You may shower two days  after your surgery.  After showering, lightly dab your incision dry. Do not take a tub bath or go swimming for 3 weeks, or until approved by your doctor at your follow-up appointment.  If you smoke, we strongly recommend that you quit.  Smoking has been proven to interfere with normal healing in your back and will dramatically reduce the success rate of your surgery. Please contact [facility] (800-QUIT-NOW) and use the resources at  www.[facility].com for assistance in stopping smoking.  Surgical Incision   If you have a dressing on your incision, you may remove it three days after your surgery. Keep your incision area clean and dry.  If you have staples or stitches on your incision, you should have a follow up scheduled for removal. If you do not have staples or stitches, you will have steri-strips (small pieces of surgical tape) or Dermabond glue. The steri-strips/glue should begin to peel away within about a week (it is fine if the steri-strips fall off before then). If the strips are still in place one week after your surgery, you may gently remove them.  Diet            You may return to your usual diet. Be sure to stay hydrated.  When to Contact us  Although your surgery and recovery will likely be uneventful, you may have some residual numbness, aches, and pains in your back and/or legs. This is normal and should improve in the next few weeks.  However, should you experience any of the following, contact us immediately: . New numbness or weakness . Pain that is progressively getting worse, and is not relieved by your pain medications or rest . Bleeding, redness, swelling, pain, or drainage from surgical incision . Chills or flu-like symptoms . Fever greater than 101.0 F (38.3 C) . Problems with bowel or bladder functions . Difficulty breathing or shortness of breath . Warmth, tenderness, or swelling in your calf  Contact Information . During office hours (Monday-Friday 9 am to 5 pm), please call your physician at [phone] . After hours and weekends, please call the [facility] Operator at [phone] and ask for the Neurosurgery Resident On Call  . For a life-threatening emergency, call 911

## 2019-09-15 NOTE — Op Note (Signed)
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 MRI revealedleftL5/S1stenosis  compressingthetraversingnerve roots. EMG confirmed lumbar radiculopathy.We discussed decompression at that level.Therisks of surgery were explained to include hematoma, infection, damage to nerve roots, CSF leak, weakness, numbness, pain, need for future surgery including fusion, heart attack, and stroke.Sheelected to proceed with surgery for symptom relief.  Procedure The patient was brought to the OR after informed consent was obtained.She was given general anesthesia and intubated by the anesthesia service. Vascular access lines were placed.The patient was then placed prone on a Wilson frameensuring all pressure points were padded.Antibiotics were administered.A time-out was performed per protocol.   The patient was sterilely prepped and draped. Fluoroscopy confirmedL4/5interspaceandanincision was planned1.5cm off midlineon theleft.The incision was instilled withlocal anesthetic with epinephrine. The skin was opened sharply and the dissection taken to the fascia. This was incised and initial dilator placedthe spinous processes and lamina of L5on theleftout to the medial edge of the facet. Serial dilatorswere inserted via fluoroscopy and the final85mm tube was  placed at depth of9cm.  The microscope was brought into the field. The overlying muscle was removed from lamina and medial facet.Next, a matchstickdrill bit was used to remove theL5lamina centrallyand going laterally.The underlying ligament was freed and removed with combination of rongeurs. The dura was seen to be full and intact.Once all ligament and soft tissue was removed, attention was turned to inspection of thelateral recess.The ligament was removed here with curettes and rongeurs until a ball tip probe passed freely.We retracted the dura medially and no disc herniation was seen. The area was irrigated profusely to remove other small fragments.Hemostasis was obtained and wound irrigated. Depomedrol was placed along thecal sac and nerve root.The working channel was removed.  The microscope was removed.Themuscle andfasciawas then closed using 0and 2-0vicryl followed by thesubcutaneous and dermal layers with 2-0 vicryluntil the epidermis was well approximated. The skin was closed withDermabond..  The patient was returned to supine position and extubated by the anesthesia service. The patient was then taken to the PACU for post-operative care whereshe was moving extremities symmetrically.   ESTIMATED BLOOD LOSS: 40 cc  SPECIMENS None  IMPLANT None   I performed the case in its entiretywith assistance of PA, Dorian Furnace, MD [phone]

## 2019-09-15 NOTE — Anesthesia Preprocedure Evaluation (Signed)
Anesthesia Evaluation  Patient identified by MRN, date of birth, ID band Patient awake    Reviewed: Allergy & Precautions, NPO status , Patient's Chart, lab work & pertinent test results  History of Anesthesia Complications Negative for: history of anesthetic complications  Airway Mallampati: II  TM Distance: >3 FB Neck ROM: Full    Dental no notable dental hx. (+) Teeth Intact   Pulmonary neg pulmonary ROS, neg COPD, Patient abstained from smoking.Not current smoker, former smoker,    Pulmonary exam normal breath sounds clear to auscultation       Cardiovascular Exercise Tolerance: Good METS(-) hypertension(-) CAD and (-) Past MI negative cardio ROS   Rhythm:Regular Rate:Normal - Systolic murmurs    Neuro/Psych  Headaches, Anxiety Depression negative psych ROS   GI/Hepatic Neg liver ROS, neg GERD  ,  Endo/Other  diabetes, Well Controlled  Renal/GU negative Renal ROS     Musculoskeletal   Abdominal (+) + obese,   Peds  Hematology   Anesthesia Other Findings Patient snores, denies stopping breathing while sleeping.  Reproductive/Obstetrics                             Anesthesia Physical Anesthesia Plan  ASA: II  Anesthesia Plan: General   Post-op Pain Management:    Induction: Intravenous  PONV Risk Score and Plan: 4 or greater and Ondansetron, Dexamethasone, Propofol infusion, TIVA and Midazolam  Airway Management Planned: Oral ETT  Additional Equipment: None  Intra-op Plan:   Post-operative Plan: Extubation in OR  Informed Consent: I have reviewed the patients History and Physical, chart, labs and discussed the procedure including the risks, benefits and alternatives for the proposed anesthesia with the patient or authorized representative who has indicated his/her understanding and acceptance.     Dental advisory given  Plan Discussed with: CRNA and  Surgeon  Anesthesia Plan Comments: (Discussed risks of anesthesia with patient, including PONV, sore throat, lip/dental damage. Rare risks discussed as well, such as cardiorespiratory sequelae. Patient understands. Patient educated on increased perioperative risk due to OSA/high risk of OSA, including cardiorespiratory sequelae. Patient educated on appropriate OSA medical care, including follow up with PCP. )        Anesthesia Quick Evaluation

## 2019-09-15 NOTE — Transfer of Care (Signed)
Immediate Anesthesia Transfer of Care Note  Patient: Michaela Caldwell  Procedure(s) Performed: LEFT L5/S1 HEMILAMINECTOMY + FORAMINOTOMY (Left )  Patient Location: PACU  Anesthesia Type:General  Level of Consciousness: drowsy and patient cooperative  Airway & Oxygen Therapy: Patient Spontanous Breathing and Patient connected to face mask oxygen  Post-op Assessment: Report given to RN and Post -op Vital signs reviewed and stable  Post vital signs: Reviewed and stable  Last Vitals:  Vitals Value Taken Time  BP 123/72 09/15/19 1211  Temp 36.8 C 09/15/19 1210  Pulse 98 09/15/19 1214  Resp 10 09/15/19 1214  SpO2 95 % 09/15/19 1214  Vitals shown include unvalidated device data.  Last Pain:  Vitals:   09/15/19 1210  TempSrc:   PainSc: Asleep         Complications: No apparent anesthesia complications

## 2019-09-15 NOTE — Anesthesia Postprocedure Evaluation (Signed)
Anesthesia Post Note  Patient: DANICKA HOURIHAN  Procedure(s) Performed: LEFT L5/S1 HEMILAMINECTOMY + FORAMINOTOMY (Left )  Patient location during evaluation: PACU Anesthesia Type: General Level of consciousness: awake and alert Pain management: pain level controlled Vital Signs Assessment: post-procedure vital signs reviewed and stable Respiratory status: spontaneous breathing, nonlabored ventilation, respiratory function stable and patient connected to nasal cannula oxygen Cardiovascular status: blood pressure returned to baseline and stable Postop Assessment: no apparent nausea or vomiting Anesthetic complications: no     Last Vitals:  Vitals:   09/15/19 1240 09/15/19 1250  BP: 113/81   Pulse: 90 (!) 106  Resp: 15 14  Temp:    SpO2: 98% 97%    Last Pain:  Vitals:   09/15/19 1250  TempSrc:   PainSc: 4                  Corinda Gubler

## 2019-09-15 NOTE — Anesthesia Procedure Notes (Addendum)
Procedure Name: Intubation Date/Time: 09/15/2019 10:00 AM Performed by: Gentry Fitz, CRNA Pre-anesthesia Checklist: Patient identified, Emergency Drugs available, Suction available and Patient being monitored Patient Re-evaluated:Patient Re-evaluated prior to induction Oxygen Delivery Method: Circle system utilized Preoxygenation: Pre-oxygenation with 100% oxygen Induction Type: IV induction Ventilation: Mask ventilation without difficulty Laryngoscope Size: Mac and 4 Grade View: Grade II Tube type: Oral Tube size: 7.0 mm Number of attempts: 1 Airway Equipment and Method: Stylet Placement Confirmation: positive ETCO2,  ETT inserted through vocal cords under direct vision and breath sounds checked- equal and bilateral Secured at: 22 cm Tube secured with: Tape Dental Injury: Teeth and Oropharynx as per pre-operative assessment

## 2019-09-15 NOTE — Interval H&P Note (Signed)
History and Physical Interval Note:  09/15/2019 9:15 AM  Michaela Caldwell  has presented today for surgery, with the diagnosis of m54.16 lumbar radiculopathy.  The various methods of treatment have been discussed with the patient and family. After consideration of risks, benefits and other options for treatment, the patient has consented to  Procedure(s): LEFT L5/S1 HEMILAMINECTOMY + FORAMINOTOMY (Left) as a surgical intervention.  The patient's history has been reviewed, patient examined, no change in status, stable for surgery.  I have reviewed the patient's chart and labs.  Questions were answered to the patient's satisfaction.     [name]

## 2019-09-15 NOTE — Progress Notes (Signed)
Moving all extremities  

## 2019-09-15 NOTE — Discharge Summary (Addendum)
Procedure: Left L5-S1 hemilaminectomy and foraminotomy Procedure date: 09/15/2019 Diagnosis:lumbar radiculopathy   History: LESETTE FRARY is s/p Left L5-S1 hemilaminectomy and foraminotomy for lumbar radiculopathy  POD0: Tolerated procedure well. Evaluated in post op recovery still disoriented from anesthesia but able to answer questions and obey commands.  Pain currently rated 3/10.  Unable to determine if lower extremity symptoms have resolved at this time.  Denies any new lower extremity pain/numbness.   Physical Exam: Vitals:   09/15/19 0920  BP: (!) 136/92  Pulse: 79  Resp: 12  Temp: (!) 97 F (36.1 C)  SpO2: 97%    Strength:5/5 throughout lower extremities bilaterally Sensation: intact and symmetric throughout lower extremities bilaterally Skin: Dressing clean dry and intact.  Data:  Recent Labs  Lab 09/11/19 0830  NA 141  K 4.3  CL 104  CO2 27  BUN 14  CREATININE 0.78  GLUCOSE 136*  CALCIUM 9.6   No results for input(s): AST, ALT, ALKPHOS in the last 168 hours.  Invalid input(s): TBILI   Recent Labs  Lab 09/11/19 0830  WBC 11.6*  HGB 15.3*  HCT 47.6*  PLT 276   Recent Labs  Lab 09/11/19 0830  APTT 27  INR 0.9         Other tests/results: No imaging reviewed.   Assessment/Plan:  JORDANNE ELSBURY is POD0 s/p left L5-S1 lumbar decompression. Continue post op pain control with currently prescribed pain medication, muscle relaxer, and tylenol as needed. She is scheduled to follow up in clinic in approximately 2 weeks to monitor progress.    Ivar Drape PA-C Department of Neurosurgery

## 2019-09-15 NOTE — H&P (Signed)
Michaela Caldwell is an 49 y.o. female.   Chief Complaint: Left leg numbness HPI: Ms. Michaela Caldwell is here for evaluation of ongoing symptoms of left leg pain and numbness. She states that this started worsening approximately months ago after a fall. She has had some numbness more in the lateral side of the left foot and in the shin but this is now progressed up the leg and towards the thigh in the groin. She does have some pain coming from the back and hip area. She does have a previous diagnosis of hip pain and treatment there. She does not endorse any obvious radiating pain. She did have epidural steroid injections would did not provide any sustained relief. She has been to physical therapy. She denies any right leg symptoms. She does feel like the left leg is now weaker than the right and the left foot is involved with all toes except the big toe.  EMG was more consistent with lumbar radiculopathy and MRI showed mild stenosis at L5/S1 on the left. She presents for decompression   Past Medical History:  Diagnosis Date  . Aneurysm (HCC)   . Anxiety   . Arthritis   . Depression   . Diabetes mellitus without complication (HCC)   . Elevated lipids   . Fibromyalgia   . GERD (gastroesophageal reflux disease)   . Headache   . Hyperlipidemia   . Hypothyroidism   . Insomnia   . Shoulder pain, right    elbow pain  . Thyroid disease     Past Surgical History:  Procedure Laterality Date  . ABDOMINAL HYSTERECTOMY    . BRAIN SURGERY    . CESAREAN SECTION    . COLONOSCOPY WITH PROPOFOL N/A 06/23/2019   Procedure: COLONOSCOPY WITH PROPOFOL;  Surgeon: Toledo, Boykin Nearing, MD;  Location: [facility] ENDOSCOPY;  Service: Gastroenterology;  Laterality: N/A;  . dislocated jaw    . EXCISION/RELEASE BURSA HIP    . KNEE ARTHROSCOPY Right   . LAPAROSCOPIC VAGINAL HYSTERECTOMY WITH SALPINGO OOPHORECTOMY Bilateral 10/21/2018   Procedure: LAPAROSCOPIC ASSISTED VAGINAL HYSTERECTOMY WITH SALPINGO OOPHORECTOMY;  Surgeon:  Schermerhorn, Ihor Austin, MD;  Location: [facility] ORS;  Service: Gynecology;  Laterality: Bilateral;  . PLANTAR FASCIECTOMY    . SHOULDER ARTHROSCOPY Right   . uterine ablation      Family History  Problem Relation Age of Onset  . Breast cancer Maternal Aunt 50  . Breast cancer Paternal Aunt 41   Social History:  reports that she quit smoking about 14 months ago. She has never used smokeless tobacco. She reports that she does not drink alcohol or use drugs.  Allergies:  Allergies  Allergen Reactions  . Augmentin [Amoxicillin-Pot Clavulanate]     Diarrhea   . Cinnamon     Cough    Medications Prior to Admission  Medication Sig Dispense Refill  . aspirin EC 81 MG EC tablet Take 1 tablet (81 mg total) by mouth daily. 30 tablet 0  . cyclobenzaprine (FLEXERIL) 5 MG tablet Take 5 mg by mouth 2 (two) times daily as needed for muscle spasms.    . diclofenac (VOLTAREN) 75 MG EC tablet Take 1 tablet (75 mg total) by mouth at bedtime.    Marland Kitchen escitalopram (LEXAPRO) 10 MG tablet Take 10 mg by mouth daily.    Marland Kitchen gabapentin (NEURONTIN) 300 MG capsule Take 300 mg by mouth See admin instructions. Take 1 capsule (300mg ) by mouth two to three times daily    . levothyroxine (SYNTHROID, LEVOTHROID) 25 MCG tablet  Take 25 mcg by mouth daily before breakfast.    . meclizine (ANTIVERT) 12.5 MG tablet Take 2 tablets (25 mg total) by mouth 3 (three) times daily as needed for dizziness. 30 tablet 0  . nortriptyline (PAMELOR) 10 MG capsule Take 30 mg by mouth at bedtime.     Marland Kitchen omeprazole (PRILOSEC) 20 MG capsule Take 20 mg by mouth daily.    Marland Kitchen oxyCODONE-acetaminophen (PERCOCET) 7.5-325 MG tablet Take 1 tablet by mouth every 4 (four) hours as needed for severe pain (pain).    . simvastatin (ZOCOR) 40 MG tablet Take 40 mg by mouth daily.    . traMADol (ULTRAM) 50 MG tablet Take 50 mg by mouth 2 (two) times daily as needed for pain.    . traZODone (DESYREL) 50 MG tablet Take 50 mg by mouth at bedtime.    . Turmeric 500  MG CAPS Take 500 mg by mouth daily.    . vitamin B-12 (CYANOCOBALAMIN) 1000 MCG tablet Take 1,000 mcg by mouth daily.    . Vitamin D, Ergocalciferol, (DRISDOL) 1.25 MG (50000 UT) CAPS capsule Take 50,000 Units by mouth every 7 (seven) days. Saturdays      No results found for this or any previous visit (from the past 48 hour(s)). No results found.  Review of Systems General ROS: Negative Psychological ROS: Negative Ophthalmic ROS: Negative ENT ROS: Negative Hematological and Lymphatic ROS: Negative  Endocrine ROS: Negative Respiratory ROS: Negative Cardiovascular ROS: Negative Gastrointestinal ROS: Negative Genito-Urinary ROS: Negative Musculoskeletal ROS: Negative Neurological ROS: Positive for leg numbness, left hip pain Dermatological ROS: Negative  Last menstrual period 10/15/2013. Physical Exam  General appearance: Alert, cooperative, appears in obvious pain Head: Normocephalic, atraumatic Eyes: Normal, EOM intact Oropharynx: Wearing facemask CV: Regular rate and rhythm Pulm: Clear to auscultation Back: Some midline tenderness, no paramedian tenderness Ext: No edema in LE bilaterally  Neurologic exam:  Mental status: alertness: alert, affect: normal Speech: fluent and clear Motor:strength symmetric 5/5 in bilateral lower extremities with some pain limitation on the left Sensory: Decreased to light touch throughout the left leg, the lower leg appears more densely affected than the upper leg but she does have decreased sensation both medially and laterally, intact on the right Reflexes: 1+ at bilateral patella and ankle Gait: Slow gait  MRI lumbar spine: There is a normal lordotic curvature with overall normal alignment. All the space heights remain normal. There is no obvious central stenosis. At L5-S1 there is some mild lateral recess stenosis on the left.  Assessment/Plan Left S1 radiculopathy  Proceed with left L5/S1 hemilaminectomy and decompression  Deetta Perla, MD 09/15/2019, 9:12 AM

## 2019-09-30 ENCOUNTER — Other Ambulatory Visit: Payer: Self-pay | Admitting: Orthopedic Surgery

## 2019-09-30 DIAGNOSIS — M25561 Pain in right knee: Secondary | ICD-10-CM

## 2019-10-14 ENCOUNTER — Other Ambulatory Visit: Payer: Self-pay

## 2019-10-14 ENCOUNTER — Ambulatory Visit
Admission: RE | Admit: 2019-10-14 | Discharge: 2019-10-14 | Disposition: A | Discharge status: Home / Self Care | Payer: 59 | Source: Ambulatory Visit | Attending: Orthopedic Surgery | Admitting: Orthopedic Surgery

## 2019-10-14 DIAGNOSIS — M25561 Pain in right knee: Secondary | ICD-10-CM | POA: Insufficient documentation

## 2019-10-23 ENCOUNTER — Other Ambulatory Visit: Payer: Self-pay | Admitting: Orthopedic Surgery

## 2019-10-24 ENCOUNTER — Other Ambulatory Visit: Payer: Self-pay

## 2019-10-24 ENCOUNTER — Other Ambulatory Visit
Admission: RE | Admit: 2019-10-24 | Discharge: 2019-10-24 | Disposition: A | Discharge status: Home / Self Care | Payer: 59 | Source: Ambulatory Visit | Attending: Orthopedic Surgery | Admitting: Orthopedic Surgery

## 2019-10-24 DIAGNOSIS — Z20822 Contact with and (suspected) exposure to covid-19: Secondary | ICD-10-CM | POA: Insufficient documentation

## 2019-10-24 DIAGNOSIS — Z01812 Encounter for preprocedural laboratory examination: Secondary | ICD-10-CM | POA: Insufficient documentation

## 2019-10-25 LAB — SARS CORONAVIRUS 2 (TAT 6-24 HRS): SARS Coronavirus 2: NEGATIVE

## 2019-10-27 ENCOUNTER — Encounter: Payer: Self-pay | Admitting: Orthopedic Surgery

## 2019-10-27 ENCOUNTER — Other Ambulatory Visit: Payer: Self-pay

## 2019-10-28 ENCOUNTER — Ambulatory Visit: Payer: 59 | Admitting: Anesthesiology

## 2019-10-28 ENCOUNTER — Encounter: Payer: Self-pay | Admitting: Orthopedic Surgery

## 2019-10-28 ENCOUNTER — Ambulatory Visit
Admission: RE | Admit: 2019-10-28 | Discharge: 2019-10-28 | Disposition: A | Discharge status: Home / Self Care | Payer: 59 | Attending: Orthopedic Surgery | Admitting: Orthopedic Surgery

## 2019-10-28 ENCOUNTER — Encounter
Admission: RE | Disposition: A | Discharge status: Home / Self Care | Payer: Self-pay | Source: Home / Self Care | Attending: Orthopedic Surgery

## 2019-10-28 DIAGNOSIS — Z91018 Allergy to other foods: Secondary | ICD-10-CM | POA: Insufficient documentation

## 2019-10-28 DIAGNOSIS — M1711 Unilateral primary osteoarthritis, right knee: Secondary | ICD-10-CM | POA: Insufficient documentation

## 2019-10-28 DIAGNOSIS — Z7982 Long term (current) use of aspirin: Secondary | ICD-10-CM | POA: Insufficient documentation

## 2019-10-28 DIAGNOSIS — E785 Hyperlipidemia, unspecified: Secondary | ICD-10-CM | POA: Insufficient documentation

## 2019-10-28 DIAGNOSIS — E119 Type 2 diabetes mellitus without complications: Secondary | ICD-10-CM | POA: Diagnosis not present

## 2019-10-28 DIAGNOSIS — Z79899 Other long term (current) drug therapy: Secondary | ICD-10-CM | POA: Insufficient documentation

## 2019-10-28 DIAGNOSIS — F419 Anxiety disorder, unspecified: Secondary | ICD-10-CM | POA: Diagnosis not present

## 2019-10-28 DIAGNOSIS — Z823 Family history of stroke: Secondary | ICD-10-CM | POA: Insufficient documentation

## 2019-10-28 DIAGNOSIS — Z880 Allergy status to penicillin: Secondary | ICD-10-CM | POA: Diagnosis not present

## 2019-10-28 DIAGNOSIS — R519 Headache, unspecified: Secondary | ICD-10-CM | POA: Diagnosis not present

## 2019-10-28 DIAGNOSIS — Z820 Family history of epilepsy and other diseases of the nervous system: Secondary | ICD-10-CM | POA: Insufficient documentation

## 2019-10-28 DIAGNOSIS — M797 Fibromyalgia: Secondary | ICD-10-CM | POA: Insufficient documentation

## 2019-10-28 DIAGNOSIS — N319 Neuromuscular dysfunction of bladder, unspecified: Secondary | ICD-10-CM | POA: Insufficient documentation

## 2019-10-28 DIAGNOSIS — Z87891 Personal history of nicotine dependence: Secondary | ICD-10-CM | POA: Insufficient documentation

## 2019-10-28 DIAGNOSIS — Y9389 Activity, other specified: Secondary | ICD-10-CM | POA: Insufficient documentation

## 2019-10-28 DIAGNOSIS — G709 Myoneural disorder, unspecified: Secondary | ICD-10-CM | POA: Insufficient documentation

## 2019-10-28 DIAGNOSIS — Z90710 Acquired absence of both cervix and uterus: Secondary | ICD-10-CM | POA: Insufficient documentation

## 2019-10-28 DIAGNOSIS — G4700 Insomnia, unspecified: Secondary | ICD-10-CM | POA: Diagnosis not present

## 2019-10-28 DIAGNOSIS — Z809 Family history of malignant neoplasm, unspecified: Secondary | ICD-10-CM | POA: Insufficient documentation

## 2019-10-28 DIAGNOSIS — S83221A Peripheral tear of medial meniscus, current injury, right knee, initial encounter: Secondary | ICD-10-CM | POA: Insufficient documentation

## 2019-10-28 DIAGNOSIS — Z83511 Family history of glaucoma: Secondary | ICD-10-CM | POA: Diagnosis not present

## 2019-10-28 DIAGNOSIS — S83241A Other tear of medial meniscus, current injury, right knee, initial encounter: Secondary | ICD-10-CM | POA: Diagnosis present

## 2019-10-28 DIAGNOSIS — Z8261 Family history of arthritis: Secondary | ICD-10-CM | POA: Diagnosis not present

## 2019-10-28 DIAGNOSIS — K219 Gastro-esophageal reflux disease without esophagitis: Secondary | ICD-10-CM | POA: Diagnosis not present

## 2019-10-28 DIAGNOSIS — X58XXXA Exposure to other specified factors, initial encounter: Secondary | ICD-10-CM | POA: Diagnosis not present

## 2019-10-28 DIAGNOSIS — Z8249 Family history of ischemic heart disease and other diseases of the circulatory system: Secondary | ICD-10-CM | POA: Insufficient documentation

## 2019-10-28 HISTORY — PX: KNEE ARTHROSCOPY WITH MENISCAL REPAIR: SHX5653

## 2019-10-28 SURGERY — ARTHROSCOPY, KNEE, WITH MENISCUS REPAIR
Anesthesia: General | Site: Knee | Laterality: Right

## 2019-10-28 MED ORDER — FENTANYL CITRATE (PF) 100 MCG/2ML IJ SOLN
INTRAMUSCULAR | Status: DC | PRN
  Administered 2019-10-28 (×4): 25 ug via INTRAVENOUS

## 2019-10-28 MED ORDER — ONDANSETRON HCL 4 MG/2ML IJ SOLN: 4.0000 mg | Freq: Once | INTRAMUSCULAR | Status: DC | PRN

## 2019-10-28 MED ORDER — LIDOCAINE-EPINEPHRINE 1 %-1:100000 IJ SOLN
INTRAMUSCULAR | Status: DC | PRN
  Administered 2019-10-28 (×2): 5 mL

## 2019-10-28 MED ORDER — CLINDAMYCIN PHOSPHATE 900 MG/50ML IV SOLN
900.0000 mg | INTRAVENOUS | Status: AC
  Administered 2019-10-28: 900 mg via INTRAVENOUS

## 2019-10-28 MED ORDER — PROPOFOL 10 MG/ML IV BOLUS
INTRAVENOUS | Status: DC | PRN
  Administered 2019-10-28: 60 mg via INTRAVENOUS

## 2019-10-28 MED ORDER — ASPIRIN EC 325 MG PO TBEC: 325.0000 mg | DELAYED_RELEASE_TABLET | Freq: Every day | ORAL | 0 refills | Status: AC

## 2019-10-28 MED ORDER — OXYCODONE HCL 5 MG PO TABS
5.0000 mg | ORAL_TABLET | Freq: Once | ORAL | Status: AC | PRN
  Administered 2019-10-28: 5 mg via ORAL

## 2019-10-28 MED ORDER — IBUPROFEN 800 MG PO TABS: 800.0000 mg | ORAL_TABLET | Freq: Three times a day (TID) | ORAL | 1 refills | Status: AC

## 2019-10-28 MED ORDER — MIDAZOLAM HCL 5 MG/5ML IJ SOLN
INTRAMUSCULAR | Status: DC | PRN
  Administered 2019-10-28 (×2): 1 mg via INTRAVENOUS

## 2019-10-28 MED ORDER — BUPIVACAINE HCL 0.5 % IJ SOLN
INTRAMUSCULAR | Status: DC | PRN
  Administered 2019-10-28 (×2): 5 mL

## 2019-10-28 MED ORDER — ACETAMINOPHEN 500 MG PO TABS: 1000.0000 mg | ORAL_TABLET | Freq: Three times a day (TID) | ORAL | 2 refills | Status: AC

## 2019-10-28 MED ORDER — ONDANSETRON HCL 4 MG/2ML IJ SOLN
INTRAMUSCULAR | Status: DC | PRN
  Administered 2019-10-28: 4 mg via INTRAVENOUS

## 2019-10-28 MED ORDER — OXYCODONE HCL 5 MG/5ML PO SOLN: 5.0000 mg | Freq: Once | ORAL | Status: AC | PRN

## 2019-10-28 MED ORDER — ONDANSETRON 4 MG PO TBDP: 4.0000 mg | ORAL_TABLET | Freq: Three times a day (TID) | ORAL | 0 refills | Status: AC | PRN

## 2019-10-28 MED ORDER — FENTANYL CITRATE (PF) 100 MCG/2ML IJ SOLN: 25.0000 ug | INTRAMUSCULAR | Status: DC | PRN

## 2019-10-28 MED ORDER — LACTATED RINGERS IV SOLN: INTRAVENOUS | Status: DC

## 2019-10-28 MED ORDER — HYDROCODONE-ACETAMINOPHEN 5-325 MG PO TABS: 1.0000 | ORAL_TABLET | ORAL | 0 refills | Status: AC | PRN

## 2019-10-28 MED ORDER — CHLORHEXIDINE GLUCONATE 4 % EX LIQD
60.0000 mL | Freq: Once | CUTANEOUS | Status: AC
  Administered 2019-10-28: 4 via TOPICAL

## 2019-10-28 MED ORDER — GLYCOPYRROLATE 0.2 MG/ML IJ SOLN
INTRAMUSCULAR | Status: DC | PRN
  Administered 2019-10-28: .1 mg via INTRAVENOUS

## 2019-10-28 MED ORDER — LIDOCAINE HCL (CARDIAC) PF 100 MG/5ML IV SOSY
PREFILLED_SYRINGE | INTRAVENOUS | Status: DC | PRN
  Administered 2019-10-28: 30 mg via INTRATRACHEAL

## 2019-10-28 MED ORDER — DEXAMETHASONE SODIUM PHOSPHATE 4 MG/ML IJ SOLN
INTRAMUSCULAR | Status: DC | PRN
  Administered 2019-10-28: 4 mg via INTRAVENOUS

## 2019-10-28 SURGICAL SUPPLY — 47 items
ADAPTER IRRIG TUBE 2 SPIKE SOL (ADAPTER) ×4 IMPLANT
BLADE SURG 15 STRL LF DISP TIS (BLADE) ×1 IMPLANT
BLADE SURG 15 STRL SS (BLADE) ×1
BLADE SURG SZ11 CARB STEEL (BLADE) ×2 IMPLANT
BNDG COHESIVE 4X5 TAN STRL (GAUZE/BANDAGES/DRESSINGS) ×2 IMPLANT
BNDG ELASTIC 6X5.8 VLCR NS LF (GAUZE/BANDAGES/DRESSINGS) ×1 IMPLANT
BNDG ESMARK 6X12 TAN STRL LF (GAUZE/BANDAGES/DRESSINGS) ×1 IMPLANT
BRACE KNEE POST OP SHORT (BRACE) IMPLANT
BUR RADIUS 3.5 (BURR) ×2 IMPLANT
BUR RADIUS 4.0X18.5 (BURR) ×1 IMPLANT
CHLORAPREP W/TINT 26 (MISCELLANEOUS) ×1 IMPLANT
COOLER POLAR GLACIER W/PUMP (MISCELLANEOUS) ×2 IMPLANT
COVER LIGHT HANDLE UNIVERSAL (MISCELLANEOUS) ×4 IMPLANT
CUFF TOURN SGL QUICK 30 (TOURNIQUET CUFF) ×1
CUFF TRNQT CYL 30X4X21-28X (TOURNIQUET CUFF) ×1 IMPLANT
CUTTER SUT KNOT PUSHER AIR (CUTTER) ×1 IMPLANT
DEVICE MENISCAL CVD UP (Anchor) ×4 IMPLANT
DRAPE IMP U-DRAPE 54X76 (DRAPES) ×2 IMPLANT
DRAPE U-SHAPE 48X52 POLY STRL (PACKS) ×2 IMPLANT
GAUZE SPONGE 4X4 12PLY STRL (GAUZE/BANDAGES/DRESSINGS) ×2 IMPLANT
GLOVE BIO SURGEON STRL SZ7.5 (GLOVE) ×4 IMPLANT
GLOVE BIOGEL PI IND STRL 8 (GLOVE) ×2 IMPLANT
GLOVE BIOGEL PI INDICATOR 8 (GLOVE) ×2
GOWN STRL REUS W/ TWL LRG LVL3 (GOWN DISPOSABLE) ×1 IMPLANT
GOWN STRL REUS W/ TWL XL LVL3 (GOWN DISPOSABLE) ×1 IMPLANT
GOWN STRL REUS W/TWL LRG LVL3 (GOWN DISPOSABLE) ×1
GOWN STRL REUS W/TWL XL LVL3 (GOWN DISPOSABLE) ×1
IV LACTATED RINGER IRRG 3000ML (IV SOLUTION) ×4
IV LR IRRIG 3000ML ARTHROMATIC (IV SOLUTION) ×4 IMPLANT
KIT TURNOVER KIT A (KITS) ×2 IMPLANT
MANIFOLD NEPTUNE II (INSTRUMENTS) ×1 IMPLANT
MAT ABSORB  FLUID 56X50 GRAY (MISCELLANEOUS) ×2
MAT ABSORB FLUID 56X50 GRAY (MISCELLANEOUS) ×2 IMPLANT
PACK ARTHROSCOPY KNEE (MISCELLANEOUS) ×2 IMPLANT
PAD WRAPON POLAR KNEE (MISCELLANEOUS) IMPLANT
PADDING CAST BLEND 6X4 STRL (MISCELLANEOUS) ×1 IMPLANT
PADDING STRL CAST 6IN (MISCELLANEOUS) ×1
SET TUBE SUCT SHAVER OUTFL 24K (TUBING) ×2 IMPLANT
SET TUBE TIP INTRA-ARTICULAR (MISCELLANEOUS) ×2 IMPLANT
SLEEVE PROTECTION STRL DISP (MISCELLANEOUS) ×1 IMPLANT
SUT ETHILON 3 0 FSLX (SUTURE) ×2 IMPLANT
SUT ETHILON 3-0 FS-10 30 BLK (SUTURE) ×2
SUTURE EHLN 3-0 FS-10 30 BLK (SUTURE) IMPLANT
SYS ANCHOR SUT W/2-0 BLU CO-BR (Anchor) IMPLANT
TUBING ARTHRO INFLOW-ONLY STRL (TUBING) ×2 IMPLANT
WAND WEREWOLF FLOW 90D (MISCELLANEOUS) ×1 IMPLANT
WRAPON POLAR PAD KNEE (MISCELLANEOUS) ×2

## 2019-10-28 NOTE — Anesthesia Postprocedure Evaluation (Signed)
Anesthesia Post Note  Patient: Michaela Caldwell  Procedure(s) Performed: KNEE ARTHROSCOPY WITH MENISCAL REPAIR CHONDROPLASTY (Right Knee)     Patient location during evaluation: PACU Anesthesia Type: General Level of consciousness: awake and alert Pain management: pain level controlled Vital Signs Assessment: post-procedure vital signs reviewed and stable Respiratory status: spontaneous breathing, nonlabored ventilation, respiratory function stable and patient connected to nasal cannula oxygen Cardiovascular status: blood pressure returned to baseline and stable Postop Assessment: no apparent nausea or vomiting Anesthetic complications: no    Scarlette Slice

## 2019-10-28 NOTE — Anesthesia Procedure Notes (Addendum)
Procedure Name: LMA Insertion Date/Time: 10/28/2019 1:07 PM Performed by: Maree Krabbe, CRNA Pre-anesthesia Checklist: Patient identified, Emergency Drugs available, Suction available, Timeout performed and Patient being monitored Patient Re-evaluated:Patient Re-evaluated prior to induction Oxygen Delivery Method: Circle system utilized Preoxygenation: Pre-oxygenation with 100% oxygen Induction Type: IV induction LMA: LMA inserted LMA Size: 4.0 Number of attempts: 1 Placement Confirmation: positive ETCO2 and breath sounds checked- equal and bilateral Tube secured with: Tape Dental Injury: Teeth and Oropharynx as per pre-operative assessment

## 2019-10-28 NOTE — Transfer of Care (Signed)
Immediate Anesthesia Transfer of Care Note  Patient: Michaela Caldwell  Procedure(s) Performed: KNEE ARTHROSCOPY WITH MENISCAL REPAIR CHONDROPLASTY (Right Knee)  Patient Location: PACU  Anesthesia Type: General  Level of Consciousness: awake, alert  and patient cooperative  Airway and Oxygen Therapy: Patient Spontanous Breathing and Patient connected to supplemental oxygen  Post-op Assessment: Post-op Vital signs reviewed, Patient's Cardiovascular Status Stable, Respiratory Function Stable, Patent Airway and No signs of Nausea or vomiting  Post-op Vital Signs: Reviewed and stable  Complications: No apparent anesthesia complications

## 2019-10-28 NOTE — Anesthesia Preprocedure Evaluation (Signed)
Anesthesia Evaluation  Patient identified by MRN, date of birth, ID band Patient awake    Reviewed: Allergy & Precautions, H&P , NPO status , Patient's Chart, lab work & pertinent test results, reviewed documented beta blocker date and time   Airway Mallampati: II  TM Distance: >3 FB Neck ROM: full    Dental no notable dental hx.    Pulmonary neg pulmonary ROS, former smoker,    Pulmonary exam normal breath sounds clear to auscultation       Cardiovascular Exercise Tolerance: Good negative cardio ROS   Rhythm:regular Rate:Normal     Neuro/Psych  Headaches, PSYCHIATRIC DISORDERS Anxiety  Neuromuscular disease    GI/Hepatic Neg liver ROS, GERD  ,  Endo/Other  diabetesHypothyroidism   Renal/GU negative Renal ROS Bladder dysfunction: fibromyalgia.   negative genitourinary   Musculoskeletal   Abdominal   Peds  Hematology negative hematology ROS (+)   Anesthesia Other Findings   Reproductive/Obstetrics negative OB ROS                             Anesthesia Physical Anesthesia Plan  ASA: II  Anesthesia Plan: General   Post-op Pain Management:    Induction:   PONV Risk Score and Plan: 3 and Dexamethasone, Midazolam and Ondansetron  Airway Management Planned:   Additional Equipment:   Intra-op Plan:   Post-operative Plan:   Informed Consent: I have reviewed the patients History and Physical, chart, labs and discussed the procedure including the risks, benefits and alternatives for the proposed anesthesia with the patient or authorized representative who has indicated his/her understanding and acceptance.     Dental Advisory Given  Plan Discussed with: CRNA  Anesthesia Plan Comments:         Anesthesia Quick Evaluation

## 2019-10-28 NOTE — H&P (Signed)
Paper H&P to be scanned into permanent record. H&P reviewed. No significant changes noted.  

## 2019-10-28 NOTE — Discharge Instructions (Signed)
Arthroscopic Knee Surgery - Meniscus Repair   Post-Op Instructions   1. Bracing or crutches: Crutches will be provided at the time of discharge from the surgery center. Keep brace locked in extension at all times except as directed by physical therapy.    2. Ice: You may be provided with a device [facility]) that allows you to ice the affected area effectively. Otherwise you can ice manually.    3. Driving:  Plan on not driving for at least four weeks. Please note that you are advised NOT to drive while taking narcotic pain medications as you may be impaired and unsafe to drive.   4. Activity: Ankle pumps several times an hour while awake to prevent blood clots. Weight bearing: 50%WB x 4 WEEKS. Use crutches for at least 4 weeks, if not 6 based on your surgery. Bending and straightening the knee is unlimited, but do not flex your knee past 90 degrees until cleared by your therapist. Elevate knee above heart level as much as possible for one week. Avoid standing more than 5 minutes (consecutively) for the first week. No exercise involving the knee until cleared by the surgeon or physical therapist.  Avoid long distance travel for 4 weeks.   5. Medications:  - You have been provided a prescription for narcotic pain medicine. After surgery, take 1-2 narcotic tablets every 4 hours if needed for severe pain. If it has tylenol (acetaminophen), please do not take a total of more than 3000mg /day of tylenol.  - A prescription for anti-nausea medication will be provided in case the narcotic medicine causes nausea - take 1 tablet every 6 hours only if nauseated.  - Take ibuprofen 800 mg every 8 hours with food to reduce post-operative knee swelling. DO NOT STOP IBUPROFEN POST-OP UNTIL INSTRUCTED TO DO SO at first post-op office visit (10-14 days after surgery).  - Take enteric coated aspirin 325 mg once daily for 4 weeks to prevent blood clots.  -Take tylenol 1000 every 8 hours for pain.  May stop tylenol 3  days after surgery or when you are having minimal pain. If your narcotic has tylenol (acetaminophen), please do not take a total of more than 3000mg /day of tylenol.    If you are taking prescription medication for anxiety, depression, insomnia, muscle spasm, chronic pain, or for attention deficit disorder you are advised that you are at a higher risk of adverse effects with use of narcotics post-op, including narcotic addiction/dependence, depressed breathing, death. If you use non-prescribed substances: alcohol, marijuana, cocaine, heroin, methamphetamines, etc., you are at a higher risk of adverse effects with use of narcotics post-op, including narcotic addiction/dependence, depressed breathing, death. You are advised that taking > 50 morphine milligram equivalents (MME) of narcotic pain medication per day results in twice the risk of overdose or death. For your prescription provided: oxycodone 5 mg - taking more than 6 tablets per day. Be advised that we will prescribe narcotics short-term, for acute post-operative pain only - 1 week for minor operations such as knee arthroscopy for meniscus tear resection, and 3 weeks for major operations such as knee repair/reconstruction surgeries.   6. Bandages: The physical therapist should change the bandages at the first post-op appointment. If needed, the dressing supplies have been provided to you. You may shower after this with waterproof bandaids covering the incisions.    7. Physical Therapy: 2 times per week for the first 4 weeks, then 1-2 times per week from weeks 4-8 post-op. Therapy typically starts on  post operative Day 3 or 4. You have been provided an order for physical therapy. The therapist will provide home exercises.   8. Work: May return to full work when off of crutches. May do light duty/desk job in approximately 1-2 weeks when off of narcotics, pain is well-controlled, and swelling has decreased.   9. Post-Op Appointments: Your first  post-op appointment will be with Dr. Posey Pronto in approximately 2 weeks time.    If you find that they have not been scheduled please call the Orthopaedic Appointment front desk at [phone].     General Anesthesia, Adult, Care After This sheet gives you information about how to care for yourself after your procedure. Your health care provider may also give you more specific instructions. If you have problems or questions, contact your health care provider. What can I expect after the procedure? After the procedure, the following side effects are common:  Pain or discomfort at the IV site.  Nausea.  Vomiting.  Sore throat.  Trouble concentrating.  Feeling cold or chills.  Weak or tired.  Sleepiness and fatigue.  Soreness and body aches. These side effects can affect parts of the body that were not involved in surgery. Follow these instructions at home:  For at least 24 hours after the procedure:  Have a responsible adult stay with you. It is important to have someone help care for you until you are awake and alert.  Rest as needed.  Do not: ? Participate in activities in which you could fall or become injured. ? Drive. ? Use heavy machinery. ? Drink alcohol. ? Take sleeping pills or medicines that cause drowsiness. ? Make important decisions or sign legal documents. ? Take care of children on your own. Eating and drinking  Follow any instructions from your health care provider about eating or drinking restrictions.  When you feel hungry, start by eating small amounts of foods that are soft and easy to digest (bland), such as toast. Gradually return to your regular diet.  Drink enough fluid to keep your urine pale yellow.  If you vomit, rehydrate by drinking water, juice, or clear broth. General instructions  If you have sleep apnea, surgery and certain medicines can increase your risk for breathing problems. Follow instructions from your health care provider  about wearing your sleep device: ? Anytime you are sleeping, including during daytime naps. ? While taking prescription pain medicines, sleeping medicines, or medicines that make you drowsy.  Return to your normal activities as told by your health care provider. Ask your health care provider what activities are safe for you.  Take over-the-counter and prescription medicines only as told by your health care provider.  If you smoke, do not smoke without supervision.  Keep all follow-up visits as told by your health care provider. This is important. Contact a health care provider if:  You have nausea or vomiting that does not get better with medicine.  You cannot eat or drink without vomiting.  You have pain that does not get better with medicine.  You are unable to pass urine.  You develop a skin rash.  You have a fever.  You have redness around your IV site that gets worse. Get help right away if:  You have difficulty breathing.  You have chest pain.  You have blood in your urine or stool, or you vomit blood. Summary  After the procedure, it is common to have a sore throat or nausea. It is also common to feel tired.  Have a responsible adult stay with you for the first 24 hours after general anesthesia. It is important to have someone help care for you until you are awake and alert.  When you feel hungry, start by eating small amounts of foods that are soft and easy to digest (bland), such as toast. Gradually return to your regular diet.  Drink enough fluid to keep your urine pale yellow.  Return to your normal activities as told by your health care provider. Ask your health care provider what activities are safe for you. This information is not intended to replace advice given to you by your health care provider. Make sure you discuss any questions you have with your health care provider. Document Revised: 08/31/2017 Document Reviewed: 04/13/2017 Elsevier Patient  Education  2020 [employer].

## 2019-10-28 NOTE — Op Note (Signed)
Operative Note    SURGERY DATE: 10/28/2019   PRE-OP DIAGNOSIS:  1. Right knee medial meniscus tear   POST-OP DIAGNOSIS:  1. Right knee medial meniscus tear 2. Right knee degenerative changes   PROCEDURES:  1. Right knee arthroscopy, medial meniscus repair 2. Right knee chondroplasty of patellofemoral, lateral, and medial compartments   SURGEON: Rosealee Albee, MD   ANESTHESIA: Gen   ESTIMATED BLOOD LOSS: minimal   TOTAL IV FLUIDS: per anesthesia   INDICATION(S): Michaela Caldwell is a 49 y.o. female who got her foot caught on a pillow and felt immediate knee pain with a sensation of her knee feeling locked out.  Clinical exam and MRI were suggestive of a medial meniscus tear. After discussion of risks, benefits, and alternatives to surgery, the patient elected to proceed.  The patient understands that there is a higher risk of re-tear with meniscus repair, but would prefer repair to maintain the normal biomechanics and structure of the knee. The patient is willing to perform the appropriate rehab and maintain weight-bearing restrictions post-operatively.   OPERATIVE FINDINGS:    Examination under anesthesia: A careful examination under anesthesia was performed.  Passive range of motion was: Hyperextension: 2.  Extension: 0.  Flexion: 130.  Lachman: normal. Pivot Shift: normal.  Posterior drawer: normal.  Varus stability in full extension: normal.  Varus stability in 30 degrees of flexion: normal.  Valgus stability in full extension: normal.  Valgus stability in 30 degrees of flexion: normal.   Intra-operative findings: A thorough arthroscopic examination of the knee was performed.  The findings are: 1. Suprapatellar pouch: Normal 2. Undersurface of median ridge: Grade 2-3 degenerative changes 3. Medial patellar facet: Grade 1 degenerative changes 4. Lateral patellar facet: Grade 1 degenerative changes 5. Trochlea: Grade 2-3 degenerative changes centrally 6. Lateral gutter/popliteus  tendon: Normal 7. Hoffa's fat pad: Inflamed 8. Medial gutter/plica: Normal 9. ACL: Normal 10. PCL: Normal 11. Medial meniscus: Small vertical tear of the posterior horn at the meniscocapsular junction at the posterior horn/body junction 12. Medial compartment cartilage: Focal area of grade 4 degenerative change measuring approximately 15 x 15 mm with surrounding diffuse grade 1-2 degenerative changes.  Normal tibial plateau 13. Lateral meniscus: Normal 14. Lateral compartment cartilage: Approximately 15 x 15 mm grade 4 focal chondral defect of the lateral femoral condyle with remainder of condyle normal.  Area of grade 3-4 degenerative changes of the medial aspect of the lateral tibial plateau in the region of the posterior horn/root of the lateral meniscus   OPERATIVE REPORT:     I identified Michaela Caldwell in the pre-operative holding area.  I marked the operative knee with my initials. I reviewed the risks and benefits of the proposed surgical intervention, and the patient (and/or patient's guardian) wished to proceed. The patient was transferred to the operative suite and placed in the supine position with all bony prominences padded.  Anesthesia was administered. Appropriate IV antibiotics were administered within 30 minutes of incision. The extremity was then prepped and draped in standard fashion. A time out was performed confirming the correct extremity, correct patient, and correct procedure.   Arthroscopy portals were marked. Local anesthetic was injected to the planned portal sites. The anterolateral portal was established with an 11 blade. The arthroscope was placed in the anterolateral portal and then into the suprapatellar pouch.  Next the medial portal was established under needle localization. A spinal needle was used to pie-crust the MCL to allow for better visualization and protect the  cartilage surfaces during instrumentation. The medial meniscus tear was identified. A diagnostic  knee scope was completed with the above findings.   The edges of the meniscus tear and capsule were roughened with a rasp and shaver to create a more optimal healing surface.  [employer] AIR all-inside device x 2 were used to reduce the torn meniscus to the stable rim and capsule.  One stitch was passed in a vertical mattress fashion on the femoral side, and the other side was also passed in a vertical mattress fashion on the tibial side.  The meniscus was probed and felt to be stable.  Chondroplasty of the lateral tibial plateau, lateral femoral condyle, medial femoral condyle, patella, and trochlea was performed in the focal regions of high-grade degenerative changes.  Microfracture of the intercondylar notch was then performed to allow for improved meniscus healing. Tourniquet was released with time of 37 minutes. Arthroscopic fluid was removed from the joint.   The portals were closed with 3-0 Nylon suture. Sterile dressings included Xeroform, 4x4s, Sof-Rol, and Bias wrap. A Polarcare was placed. A T-scope hinged knee brace was applied.  The patient was then awakened and taken to the PACU hemodynamically stable without complication.     POSTOPERATIVE PLAN: The patient will be discharged home today once they meet PACU criteria. Aspirin 325 mg daily was prescribed for 4 weeks for DVT prophylaxis.  Physical therapy will start on POD#3-4. 50%WB x 4 weeks. F/U in 2 weeks.

## 2019-10-29 ENCOUNTER — Encounter: Payer: Self-pay | Admitting: *Deleted

## 2020-02-09 IMAGING — CT CT CERVICAL SPINE W/O CM
3 of 5 series · 12 of 33 positions shown, 14 images · non-contrast
Comparison: None.

CLINICAL DATA: Neck pain.  Neck and shoulder pain with head pain.

EXAM:
CT CERVICAL SPINE WITHOUT CONTRAST
TECHNIQUE: Multidetector CT imaging of the cervical spine was performed without
intravenous contrast. Multiplanar CT image reconstructions were also
generated.

[Series 13: orth-c-spine · axial · 0.24mm/px · z∈[+15,+107]mm · 4 of 86 slices shown, 5 images]
[im 18/86  soft-tissue]
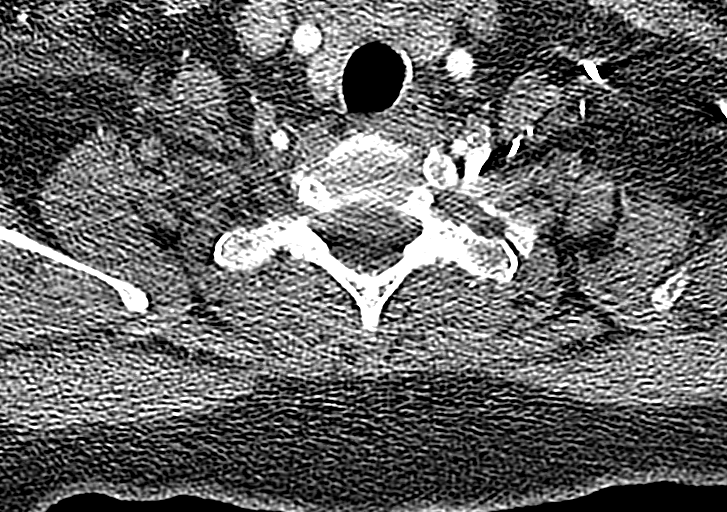
[im 18/86  bone]
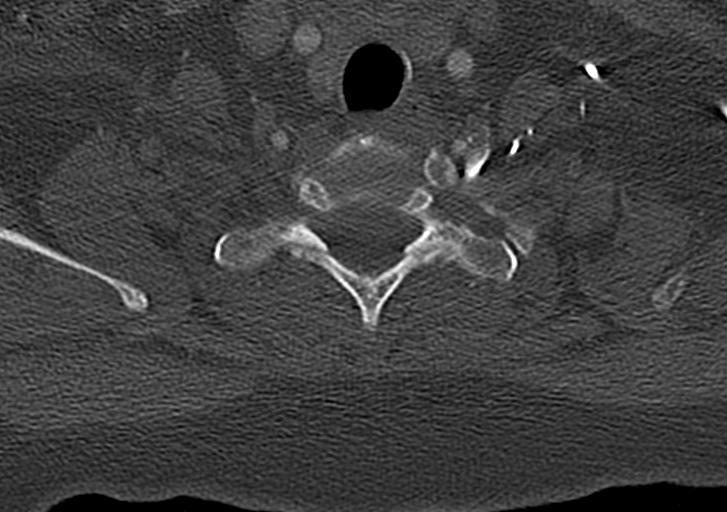
[im 35/86  bone]
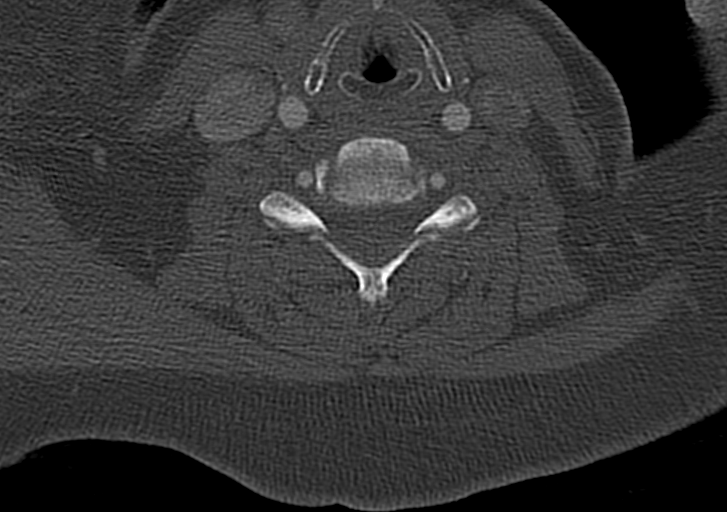
[im 52/86  bone]
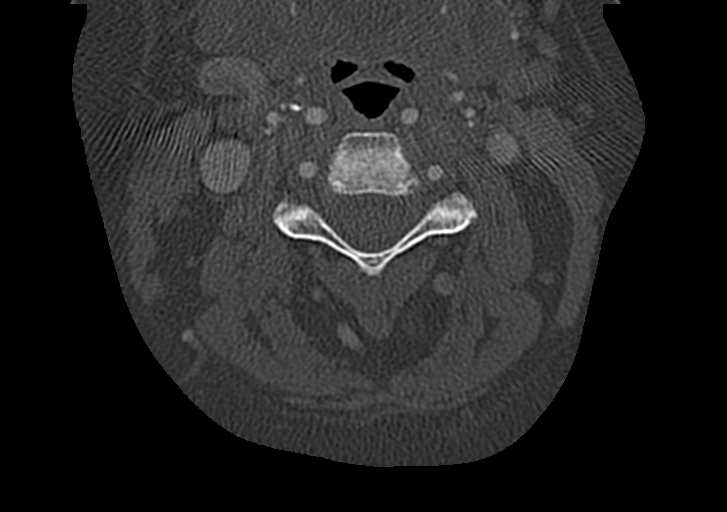
[im 69/86  bone]
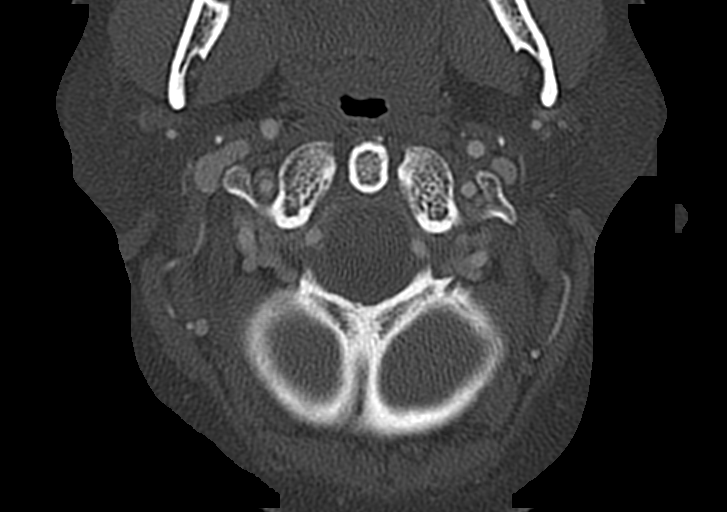

[Series 14: cor-c-spine · coronal · 0.24mm/px · 3 of 63 slices shown]
[im 15/63  bone]
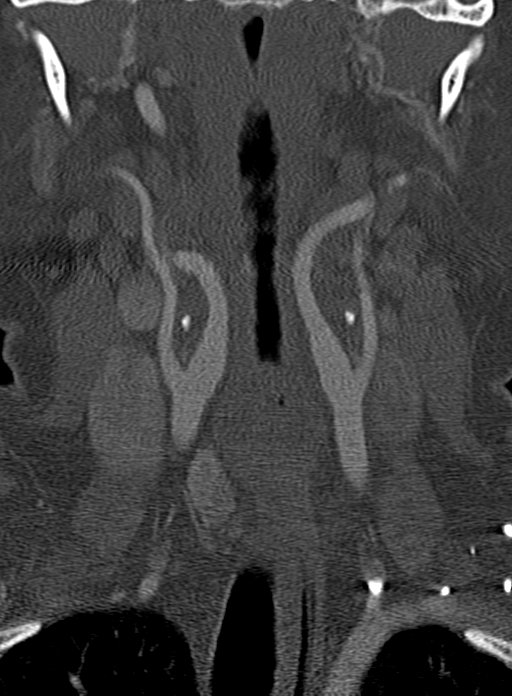
[im 26/63  bone]
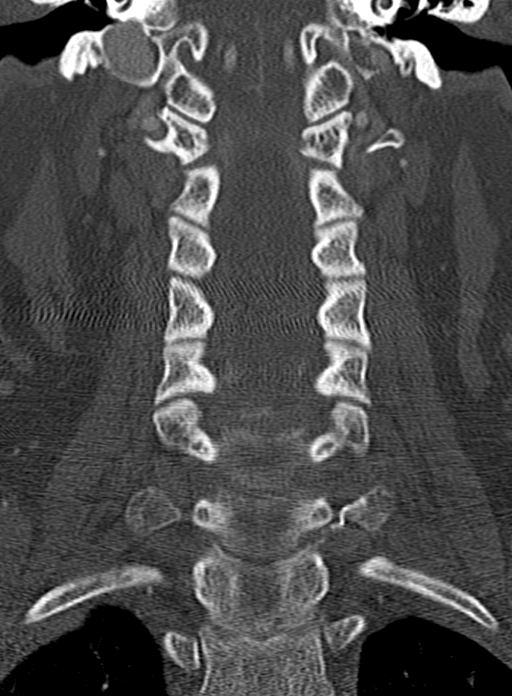
[im 37/63  bone]
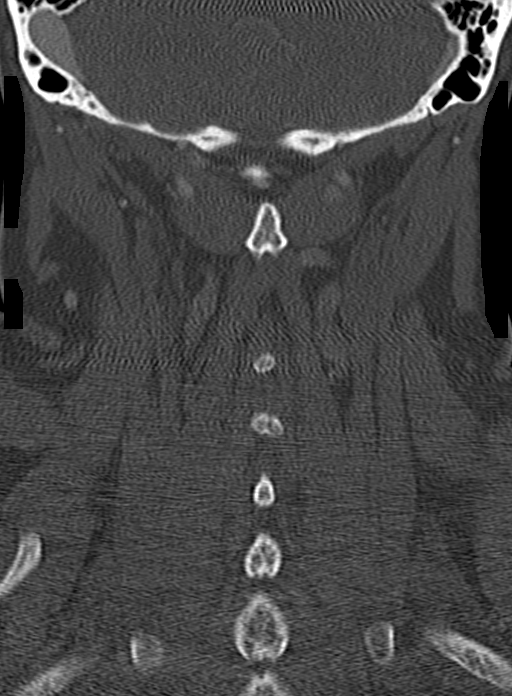

[Series 15: sag-c-spine · sagittal · 0.24mm/px · 5 of 63 slices shown, 6 images]
[im 21/63  bone]
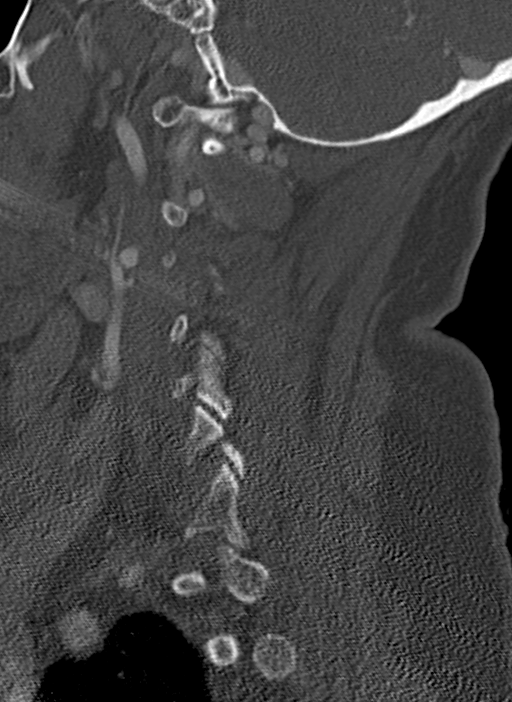
[im 26/63  bone]
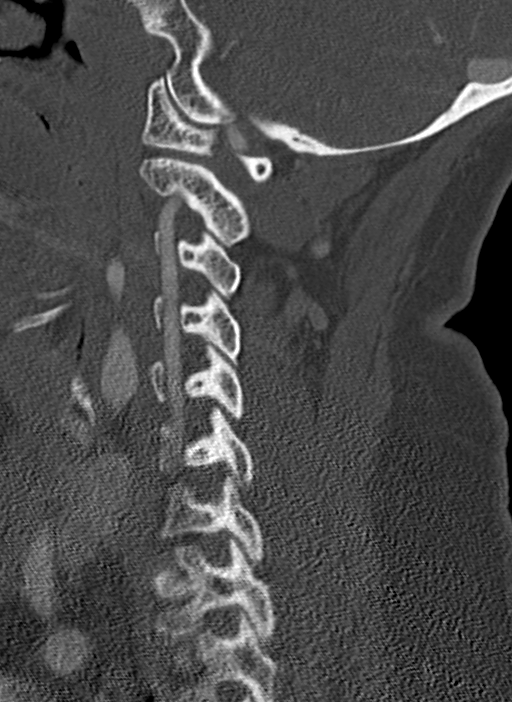
[im 32/63  soft-tissue]
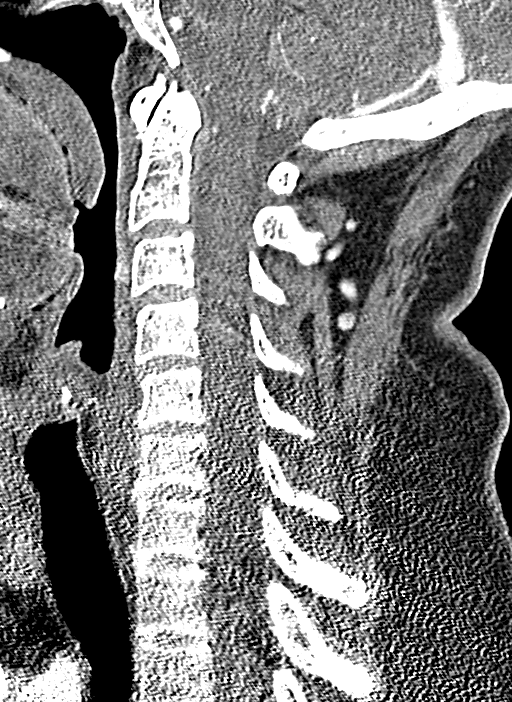
[im 32/63  bone]
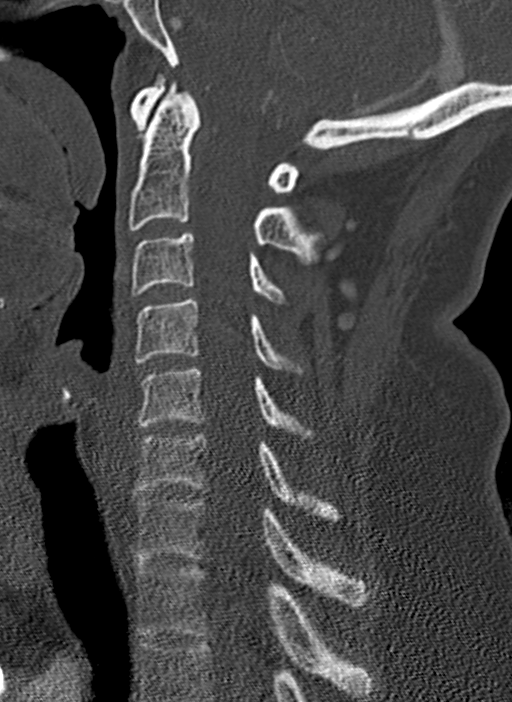
[im 37/63  bone]
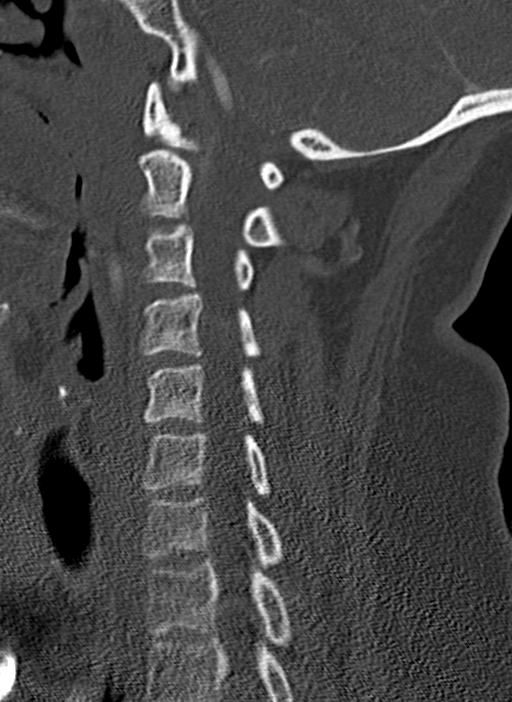
[im 42/63  bone]
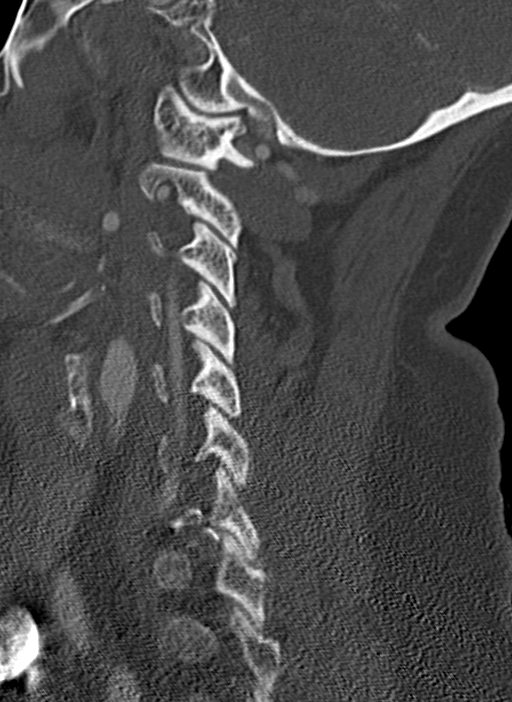

[12 of 33 positions shown; findings below may reference images not displayed]

FINDINGS: Alignment: Cervical spine CT dated was to right from CTA head neck
from yesterday, 06/06/2019.

Normal alignment.  Straightening of the cervical lordosis.

Skull base and vertebrae: Negative for fracture or mass.

Soft tissues and spinal canal: Normal soft tissue surrounding the
cervical spine.

Disc levels: Negative for spinal stenosis. No significant
degenerative change or spurring. No focal disc protrusion.

Upper chest: Negative

Other: None
IMPRESSION: Negative CT cervical spine.
